# Patient Record
Sex: Female | Born: 1985 | Race: Black or African American | Hispanic: No | Marital: Single | Smoking: Never smoker
Health system: Southern US, Community
[De-identification: ages and names within clinical notes are randomized; demographics above are authoritative.]

## PROBLEM LIST (undated history)

## (undated) DIAGNOSIS — F419 Anxiety disorder, unspecified: Secondary | ICD-10-CM

## (undated) DIAGNOSIS — D649 Anemia, unspecified: Secondary | ICD-10-CM

## (undated) DIAGNOSIS — M222X1 Patellofemoral disorders, right knee: Secondary | ICD-10-CM

## (undated) DIAGNOSIS — M722 Plantar fascial fibromatosis: Secondary | ICD-10-CM

## (undated) DIAGNOSIS — Z973 Presence of spectacles and contact lenses: Secondary | ICD-10-CM

## (undated) DIAGNOSIS — F32A Depression, unspecified: Secondary | ICD-10-CM

## (undated) DIAGNOSIS — D219 Benign neoplasm of connective and other soft tissue, unspecified: Secondary | ICD-10-CM

## (undated) DIAGNOSIS — J45909 Unspecified asthma, uncomplicated: Secondary | ICD-10-CM

## (undated) DIAGNOSIS — I10 Essential (primary) hypertension: Secondary | ICD-10-CM

## (undated) DIAGNOSIS — G5600 Carpal tunnel syndrome, unspecified upper limb: Secondary | ICD-10-CM

## (undated) DIAGNOSIS — R7303 Prediabetes: Secondary | ICD-10-CM

## (undated) HISTORY — DX: Anemia, unspecified: D64.9

## (undated) HISTORY — DX: Benign neoplasm of connective and other soft tissue, unspecified: D21.9

## (undated) HISTORY — DX: Depression, unspecified: F32.A

## (undated) HISTORY — DX: Anxiety disorder, unspecified: F41.9

## (undated) HISTORY — DX: Essential (primary) hypertension: I10

---

## 2016-09-29 ENCOUNTER — Emergency Department (HOSPITAL_COMMUNITY): Payer: Self-pay

## 2016-09-29 ENCOUNTER — Emergency Department (HOSPITAL_COMMUNITY)
Admission: EM | Admit: 2016-09-29 | Discharge: 2016-09-29 | Disposition: A | Discharge status: Home / Self Care | Payer: Self-pay | Attending: Emergency Medicine | Admitting: Emergency Medicine

## 2016-09-29 ENCOUNTER — Encounter (HOSPITAL_COMMUNITY): Payer: Self-pay | Admitting: Emergency Medicine

## 2016-09-29 DIAGNOSIS — Z5321 Procedure and treatment not carried out due to patient leaving prior to being seen by health care provider: Secondary | ICD-10-CM | POA: Insufficient documentation

## 2016-09-29 DIAGNOSIS — J45901 Unspecified asthma with (acute) exacerbation: Secondary | ICD-10-CM | POA: Insufficient documentation

## 2016-09-29 HISTORY — DX: Unspecified asthma, uncomplicated: J45.909

## 2016-09-29 IMAGING — CR DG CHEST 2V
2 series · 2 of 2 positions shown · non-contrast
Comparison: None.

CLINICAL DATA: Difficulty breathing, asthma exacerbation

EXAM:
CHEST  2 VIEW

[w chest pa]
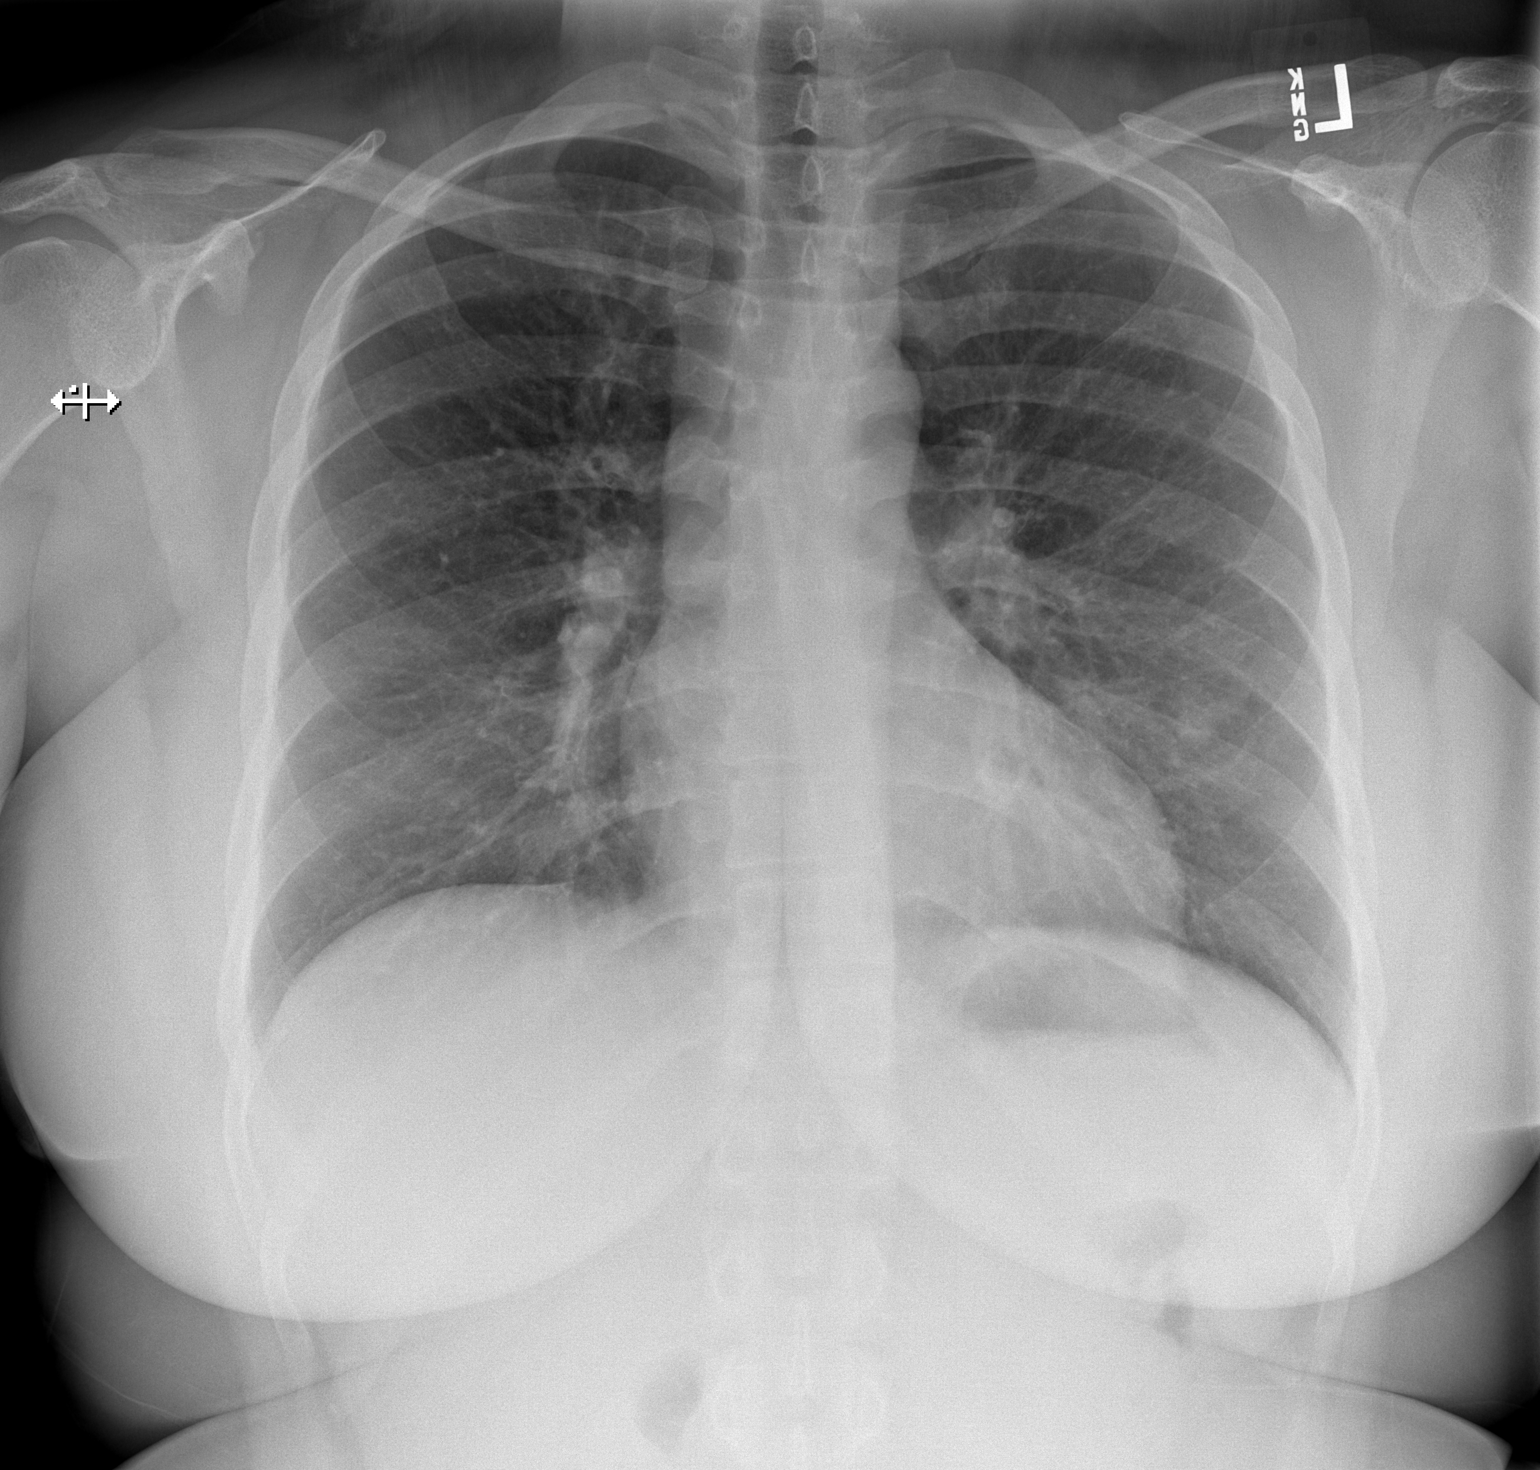

[w chest lat]
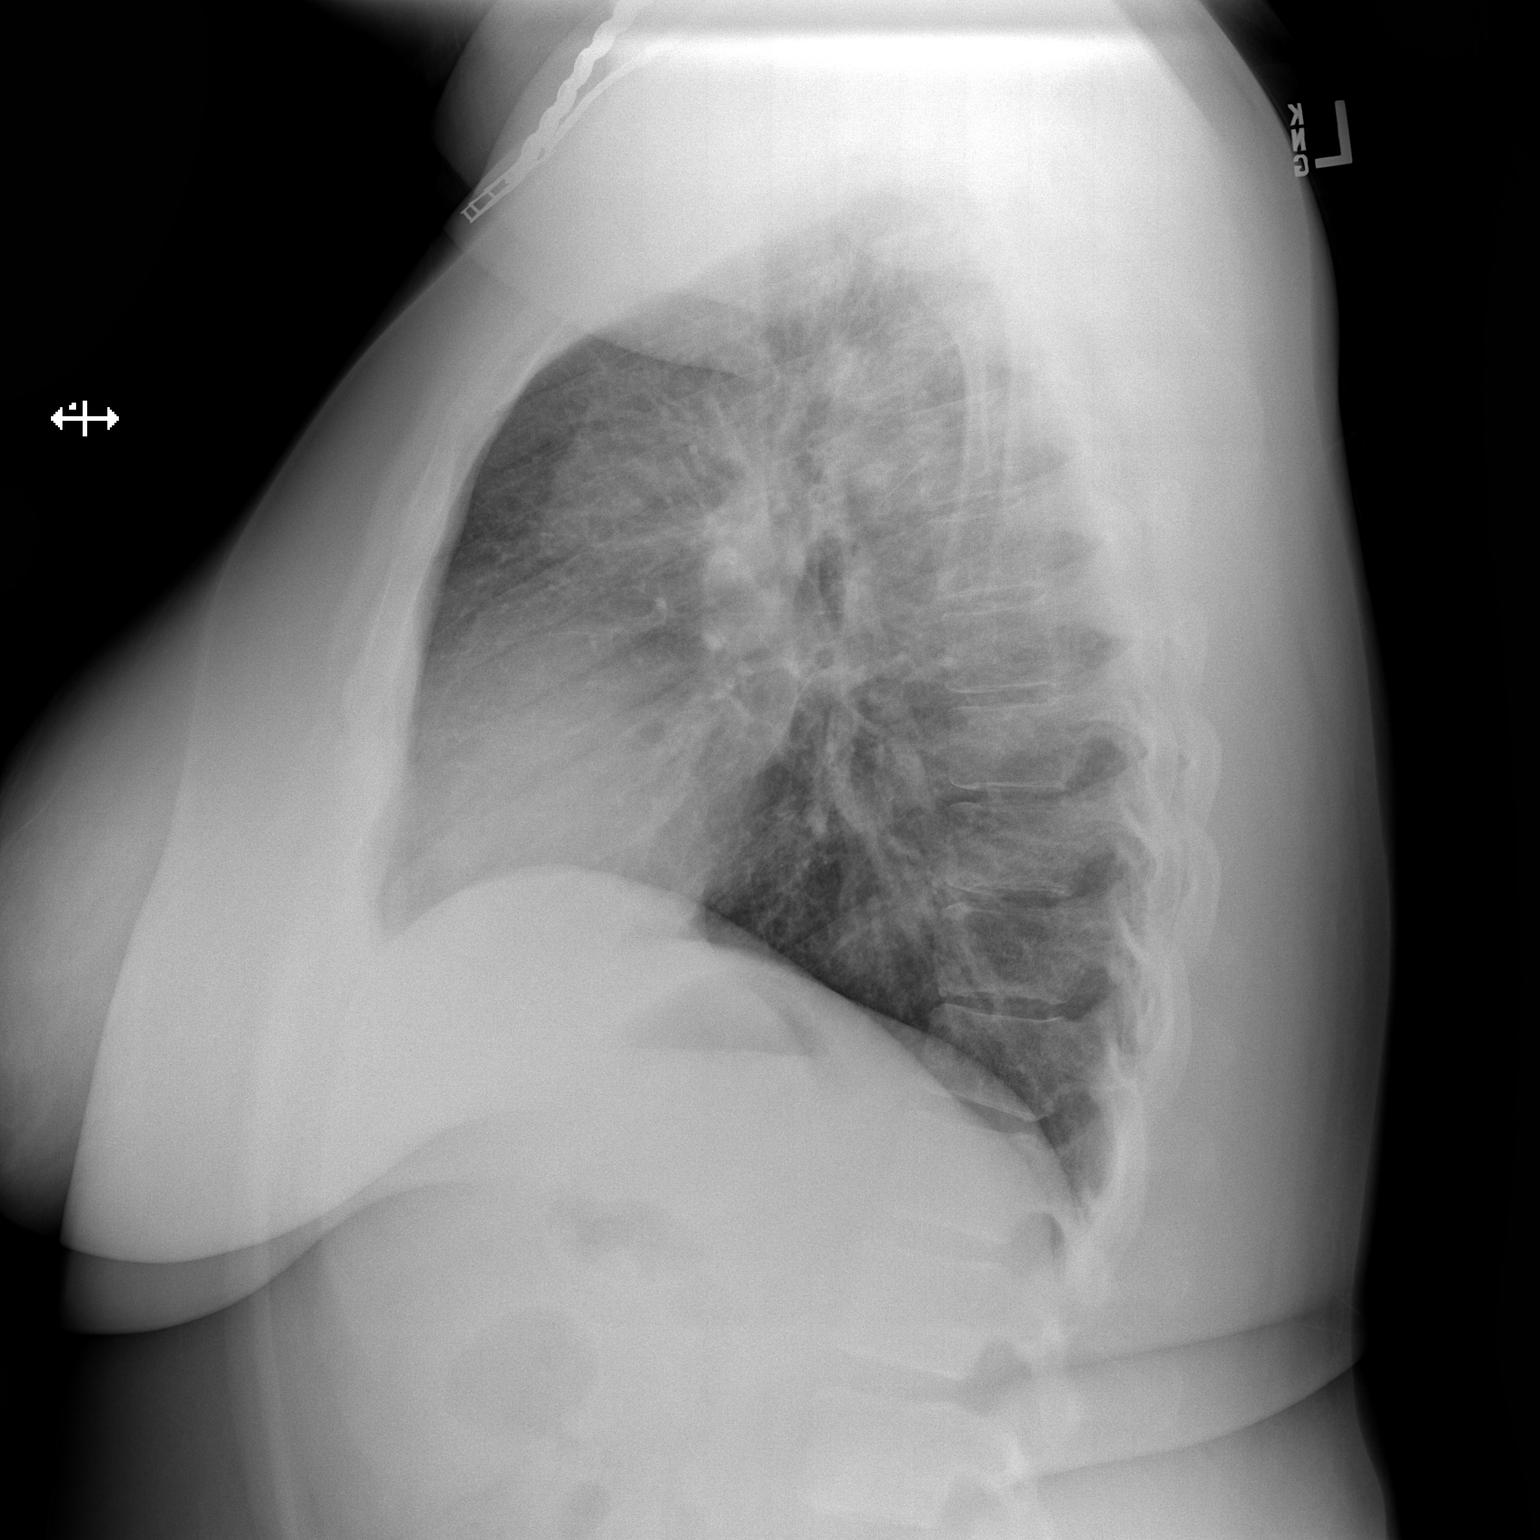

[2 of 2 positions shown; findings below may reference images not displayed]

FINDINGS: The right lung is clear. Subtle patchy opacity in the left mid lung.
No pleural effusion. Normal heart size. No pneumothorax.
IMPRESSION: Mild patchy opacity in the left lower lung could reflect atelectasis
or a small focus of infiltrate

## 2016-09-29 MED ORDER — ALBUTEROL SULFATE (2.5 MG/3ML) 0.083% IN NEBU
5.0000 mg | INHALATION_SOLUTION | Freq: Once | RESPIRATORY_TRACT | Status: AC
  Administered 2016-09-29: 5 mg via RESPIRATORY_TRACT
  Filled 2016-09-29: qty 6

## 2016-09-29 NOTE — ED Notes (Signed)
Pt called for room no answer

## 2016-09-29 NOTE — ED Triage Notes (Signed)
Pt from home with complaints of asthma exacerbation. Pt states she caught a cold about a week ago and has had difficulty ever since. Pt states she has used her inhaler "many times" this evening with little relief. Pt has audible expiratory wheezes. Pt is able to maintain her oxygen saturation at 99% on RA

## 2017-11-27 ENCOUNTER — Ambulatory Visit: Payer: Self-pay | Admitting: Nurse Practitioner

## 2017-11-27 VITALS — BP 120/90 | HR 80 | Temp 98.4°F | Resp 18 | Wt 276.2 lb

## 2017-11-27 DIAGNOSIS — J209 Acute bronchitis, unspecified: Secondary | ICD-10-CM

## 2017-11-27 MED ORDER — PREDNISONE 10 MG (21) PO TBPK: ORAL_TABLET | ORAL | 0 refills | Status: AC

## 2017-11-27 MED ORDER — PREDNISONE 10 MG (21) PO TBPK: ORAL_TABLET | ORAL | 0 refills | Status: DC

## 2017-11-27 MED ORDER — DEXTROMETHORPHAN-GUAIFENESIN 10-100 MG/5ML PO LIQD: 5.0000 mL | ORAL | 0 refills | Status: AC | PRN

## 2017-11-27 MED ORDER — ALBUTEROL SULFATE HFA 108 (90 BASE) MCG/ACT IN AERS: 2.0000 | INHALATION_SPRAY | Freq: Four times a day (QID) | RESPIRATORY_TRACT | 0 refills | Status: DC | PRN

## 2017-11-27 MED ORDER — DEXTROMETHORPHAN-GUAIFENESIN 10-100 MG/5ML PO LIQD: 5.0000 mL | ORAL | 0 refills | Status: DC | PRN

## 2017-11-27 NOTE — Progress Notes (Signed)
Subjective:     Alexis Mccormick is a 32 y.o. female here for evaluation of a cough. Onset of symptoms was 2 weeks ago. Symptoms have been gradually worsening since that time. The cough is productive of clear sputum and is aggravated by  eating and reclining position. Associated symptoms include: postnasal drip, shortness of breath and sputum production.  Patient states her symptoms started with a sore throat, and a runny nose which have also resolved since this time.  Patient does have a history of asthma. Patient does not have a history of environmental allergens. Patient has not traveled recently. Patient does not have a history of smoking.  He has tried Robitussin, which made her cough worse, and DayQuil and NyQuil with no improvement.  The patient states she gets nauseated mostly in the mornings after she wakes up due to the phlegm that is in her chest and from coughing so much.  The patient denies any recent asthma exacerbations since last year.  The patient states she ran out of her inhaler, states that is very expensive.   The following portions of the patient's history were reviewed and updated as appropriate: allergies, current medications and past medical history.  Review of Systems Constitutional: positive for anorexia and fatigue, negative for chills, fevers, malaise and sweats Eyes: negative Ears, nose, mouth, throat, and face: positive for sore throat, negative for ear drainage, earaches and hoarseness Respiratory: positive for asthma, cough, dyspnea on exertion and sputum, negative for hemoptysis, pleurisy/chest pain, pneumonia and stridor Cardiovascular: negative Gastrointestinal: positive for decreased appetite, negative for abdominal pain, diarrhea, nausea and vomiting Neurological: negative Allergic/Immunologic: negative    Objective:   BP 120/90 (BP Location: Right Arm, Patient Position: Sitting, Cuff Size: Large)   Pulse 80   Temp 98.4 F (36.9 C) (Oral)   Resp 18   Wt 276  lb 3.2 oz (125.3 kg)   SpO2 98%   BMI 47.41 kg/m  General appearance: alert, cooperative, fatigued and no distress Head: Normocephalic, without obvious abnormality, atraumatic Eyes: conjunctivae/corneas clear. PERRL, EOM's intact. Fundi benign. Ears: normal TM's and external ear canals both ears Nose: Nares normal. Septum midline. Mucosa normal. No drainage or sinus tenderness. Throat: lips, mucosa, and tongue normal; teeth and gums normal Lungs: clear to auscultation bilaterally Heart: regular rate and rhythm, S1, S2 normal, no murmur, click, rub or gallop Abdomen: soft, non-tender; bowel sounds normal; no masses,  no organomegaly Pulses: 2+ and symmetric Skin: Skin color, texture, turgor normal. No rashes or lesions Lymph nodes: cervical and submandibular nodes normal Neurologic: Grossly normal    Assessment:    Acute Bronchitis    Plan:    Explained lack of efficacy of antibiotics in viral disease. Antitussives per medication orders. Avoid exposure to tobacco smoke and fumes. B-agonist inhaler. Call if shortness of breath worsens, blood in sputum, change in character of cough, development of fever or chills, inability to maintain nutrition and hydration. Avoid exposure to tobacco smoke and fumes. Sterapred Dose Pack. Also refilled patient's albuterol inhaler.  Instructed patient to go to the ER if she develops increased difficulty breathing shortness of breath or other concerns.  Patient verbalizes understanding and has no questions at time of discharge. Meds ordered this encounter  Medications  . predniSONE (STERAPRED UNI-PAK 21 TAB) 10 MG (21) TBPK tablet    Sig: Take as directed.    Dispense:  21 tablet    Refill:  0    Order Specific Question:   Supervising Provider  Answer:   Ricard Dillon [[id number]]  . dextromethorphan-guaiFENesin (ROBITUSSIN-DM) 10-100 MG/5ML liquid    Sig: Take 5 mLs by mouth every 4 (four) hours as needed for up to 10 days for cough.    Dispense:   310 mL    Refill:  0    Order Specific Question:   Supervising Provider    Answer:   Ricard Dillon [[id number]]  . albuterol (PROVENTIL HFA;VENTOLIN HFA) 108 (90 Base) MCG/ACT inhaler    Sig: Inhale 2 puffs into the lungs every 6 (six) hours as needed for wheezing or shortness of breath.    Dispense:  1 Inhaler    Refill:  0    Order Specific Question:   Supervising Provider    Answer:   Ricard Dillon [phone]

## 2017-11-27 NOTE — Patient Instructions (Signed)
Acute Bronchitis, Adult Acute bronchitis is sudden (acute) swelling of the air tubes (bronchi) in the lungs. Acute bronchitis causes these tubes to fill with mucus, which can make it hard to breathe. It can also cause coughing or wheezing. In adults, acute bronchitis usually goes away within 2 weeks. A cough caused by bronchitis may last up to 3 weeks. Smoking, allergies, and asthma can make the condition worse. Repeated episodes of bronchitis may cause further lung problems, such as chronic obstructive pulmonary disease (COPD). What are the causes? This condition can be caused by germs and by substances that irritate the lungs, including:  Cold and flu viruses. This condition is most often caused by the same virus that causes a cold.  Bacteria.  Exposure to tobacco smoke, dust, fumes, and air pollution.  What increases the risk? This condition is more likely to develop in people who:  Have close contact with someone with acute bronchitis.  Are exposed to lung irritants, such as tobacco smoke, dust, fumes, and vapors.  Have a weak immune system.  Have a respiratory condition such as asthma.  What are the signs or symptoms? Symptoms of this condition include:  A cough.  Coughing up clear, yellow, or green mucus.  Wheezing.  Chest congestion.  Shortness of breath.  A fever.  Body aches.  Chills.  A sore throat.  How is this diagnosed? This condition is usually diagnosed with a physical exam. During the exam, your health care provider may order tests, such as chest X-rays, to rule out other conditions. He or she may also:  Test a sample of your mucus for bacterial infection.  Check the level of oxygen in your blood. This is done to check for pneumonia.  Do a chest X-ray or lung function testing to rule out pneumonia and other conditions.  Perform blood tests.  Your health care provider will also ask about your symptoms and medical history. How is this  treated? Most cases of acute bronchitis clear up over time without treatment. Your health care provider may recommend:  Drinking more fluids. Drinking more makes your mucus thinner, which may make it easier to breathe.  Taking a medicine for a fever or cough.  Taking an antibiotic medicine.  Using an inhaler to help improve shortness of breath and to control a cough.  Using a cool mist vaporizer or humidifier to make it easier to breathe.  Follow these instructions at home: Medicines  Take over-the-counter and prescription medicines only as told by your health care provider.  If you were prescribed an antibiotic, take it as told by your health care provider. Do not stop taking the antibiotic even if you start to feel better. General instructions  Get plenty of rest.  Drink enough fluids to keep your urine clear or pale yellow.  Avoid smoking and secondhand smoke. Exposure to cigarette smoke or irritating chemicals will make bronchitis worse. If you smoke and you need help quitting, ask your health care provider. Quitting smoking will help your lungs heal faster.  Use an inhaler, cool mist vaporizer, or humidifier as told by your health care provider.  Keep all follow-up visits as told by your health care provider. This is important. How is this prevented? To lower your risk of getting this condition again:  Wash your hands often with soap and water. If soap and water are not available, use hand sanitizer.  Avoid contact with people who have cold symptoms.  Try not to touch your hands to your   mouth, nose, or eyes.  Make sure to get the flu shot every year.  Contact a health care provider if:  Your symptoms do not improve in 2 weeks of treatment. Get help right away if:  You cough up blood.  You have chest pain.  You have severe shortness of breath.  You become dehydrated.  You faint or keep feeling like you are going to faint.  You keep vomiting.  You have a  severe headache.  Your fever or chills gets worse. This information is not intended to replace advice given to you by your health care provider. Make sure you discuss any questions you have with your health care provider. Document Released: 07/04/2004 Document Revised: 12/20/2015 Document Reviewed: 11/15/2015 Elsevier Interactive Patient Education  2018 [place].  Cough, Adult Coughing is a reflex that clears your throat and your airways. Coughing helps to heal and protect your lungs. It is normal to cough occasionally, but a cough that happens with other symptoms or lasts a long time may be a sign of a condition that needs treatment. A cough may last only 2-3 weeks (acute), or it may last longer than 8 weeks (chronic). What are the causes? Coughing is commonly caused by:  Breathing in substances that irritate your lungs.  A viral or bacterial respiratory infection.  Allergies.  Asthma.  Postnasal drip.  Smoking.  Acid backing up from the stomach into the esophagus (gastroesophageal reflux).  Certain medicines.  Chronic lung problems, including COPD (or rarely, lung cancer).  Other medical conditions such as heart failure.  Follow these instructions at home: Pay attention to any changes in your symptoms. Take these actions to help with your discomfort:  Take medicines only as told by your health care provider. ? If you were prescribed an antibiotic medicine, take it as told by your health care provider. Do not stop taking the antibiotic even if you start to feel better. ? Talk with your health care provider before you take a cough suppressant medicine.  Drink enough fluid to keep your urine clear or pale yellow.  If the air is dry, use a cold steam vaporizer or humidifier in your bedroom or your home to help loosen secretions.  Avoid anything that causes you to cough at work or at home.  If your cough is worse at night, try sleeping in a semi-upright  position.  Avoid cigarette smoke. If you smoke, quit smoking. If you need help quitting, ask your health care provider.  Avoid caffeine.  Avoid alcohol.  Rest as needed.  Contact a health care provider if:  You have new symptoms.  You cough up pus.  Your cough does not get better after 2-3 weeks, or your cough gets worse.  You cannot control your cough with suppressant medicines and you are losing sleep.  You develop pain that is getting worse or pain that is not controlled with pain medicines.  You have a fever.  You have unexplained weight loss.  You have night sweats. Get help right away if:  You cough up blood.  You have difficulty breathing.  Your heartbeat is very fast. This information is not intended to replace advice given to you by your health care provider. Make sure you discuss any questions you have with your health care provider. Document Released: 11/23/2010 Document Revised: 11/02/2015 Document Reviewed: 08/03/2014 Elsevier Interactive Patient Education  [employer].

## 2017-11-27 NOTE — Addendum Note (Signed)
Addended by: Cari Caraway on: 11/27/2017 04:21 PM   Modules accepted: Orders

## 2018-06-10 DIAGNOSIS — U071 COVID-19: Secondary | ICD-10-CM

## 2018-06-10 HISTORY — DX: COVID-19: U07.1

## 2019-04-23 ENCOUNTER — Other Ambulatory Visit: Payer: Self-pay

## 2019-04-23 DIAGNOSIS — Z20822 Contact with and (suspected) exposure to covid-19: Secondary | ICD-10-CM

## 2019-04-26 LAB — NOVEL CORONAVIRUS, NAA: SARS-CoV-2, NAA: DETECTED — AB

## 2020-06-10 HISTORY — PX: WISDOM TOOTH EXTRACTION: SHX21

## 2020-06-10 HISTORY — PX: ESOPHAGOGASTRODUODENOSCOPY: SHX1529

## 2020-06-10 HISTORY — PX: GRAFT APPLICATION: SHX6696

## 2020-07-18 ENCOUNTER — Encounter (HOSPITAL_COMMUNITY): Payer: Self-pay

## 2020-07-18 ENCOUNTER — Emergency Department (HOSPITAL_COMMUNITY)
Admission: EM | Admit: 2020-07-18 | Discharge: 2020-07-18 | Disposition: A | Discharge status: Home / Self Care | Payer: 59 | Attending: Emergency Medicine | Admitting: Emergency Medicine

## 2020-07-18 ENCOUNTER — Emergency Department (HOSPITAL_COMMUNITY): Payer: 59

## 2020-07-18 DIAGNOSIS — M791 Myalgia, unspecified site: Secondary | ICD-10-CM | POA: Insufficient documentation

## 2020-07-18 DIAGNOSIS — Y92481 Parking lot as the place of occurrence of the external cause: Secondary | ICD-10-CM | POA: Diagnosis not present

## 2020-07-18 DIAGNOSIS — Y9301 Activity, walking, marching and hiking: Secondary | ICD-10-CM | POA: Insufficient documentation

## 2020-07-18 DIAGNOSIS — J45909 Unspecified asthma, uncomplicated: Secondary | ICD-10-CM | POA: Insufficient documentation

## 2020-07-18 DIAGNOSIS — M25562 Pain in left knee: Secondary | ICD-10-CM | POA: Insufficient documentation

## 2020-07-18 IMAGING — CR DG KNEE COMPLETE 4+V*L*
4 series · 4 of 4 positions shown · non-contrast
Comparison: None.

CLINICAL DATA: 34-year-old female with left knee pain.

EXAM:
LEFT KNEE - COMPLETE 4+ VIEW

[t knee ap left]
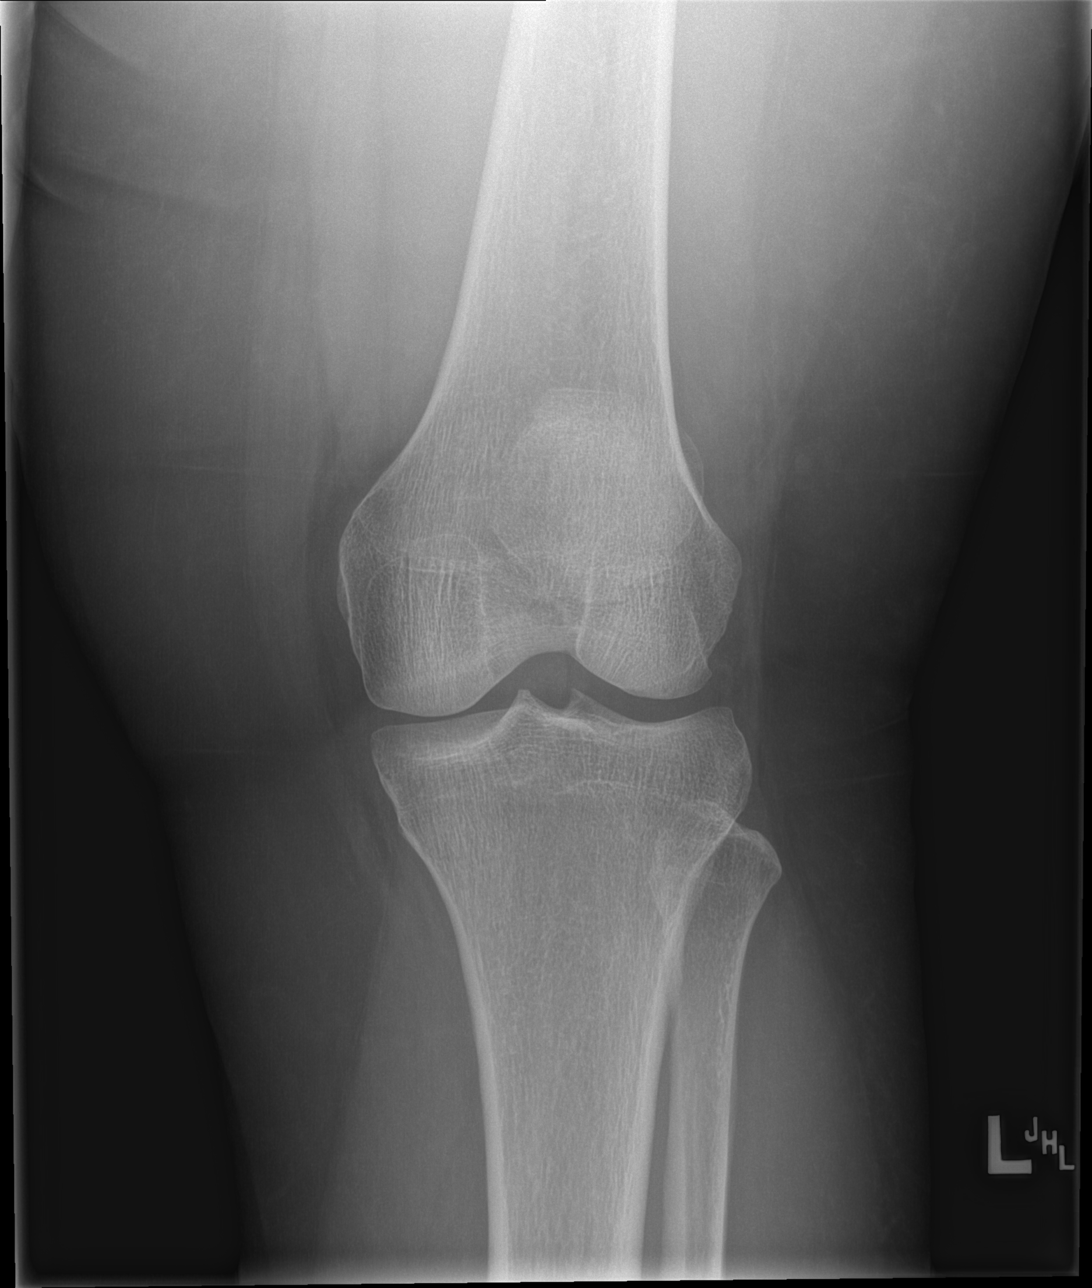

[t knee obl left (1 of 2)]
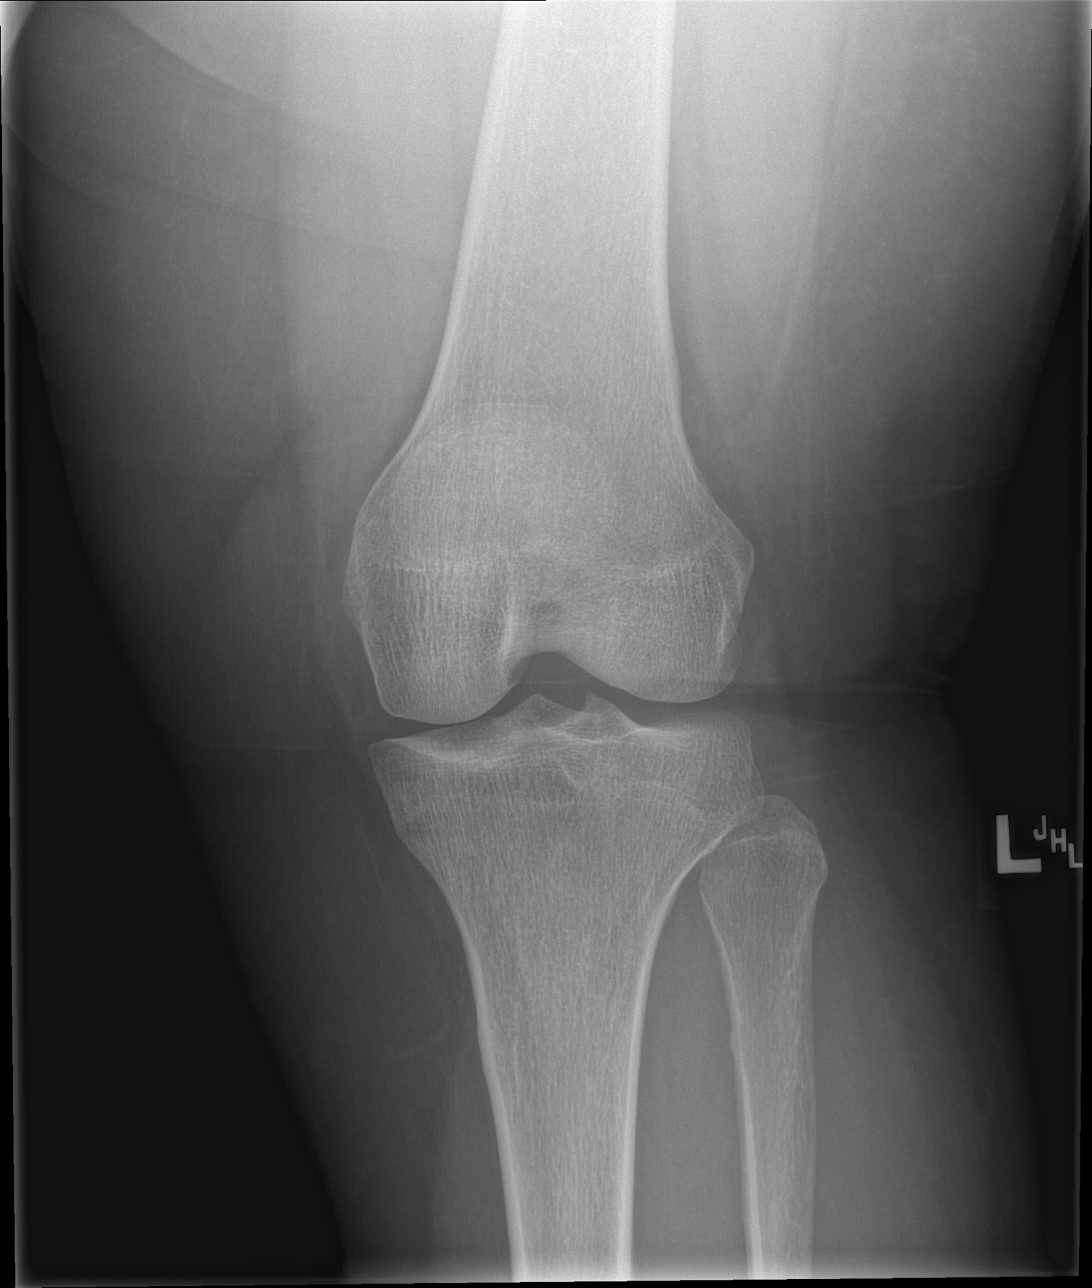

[t knee obl left (2 of 2)]
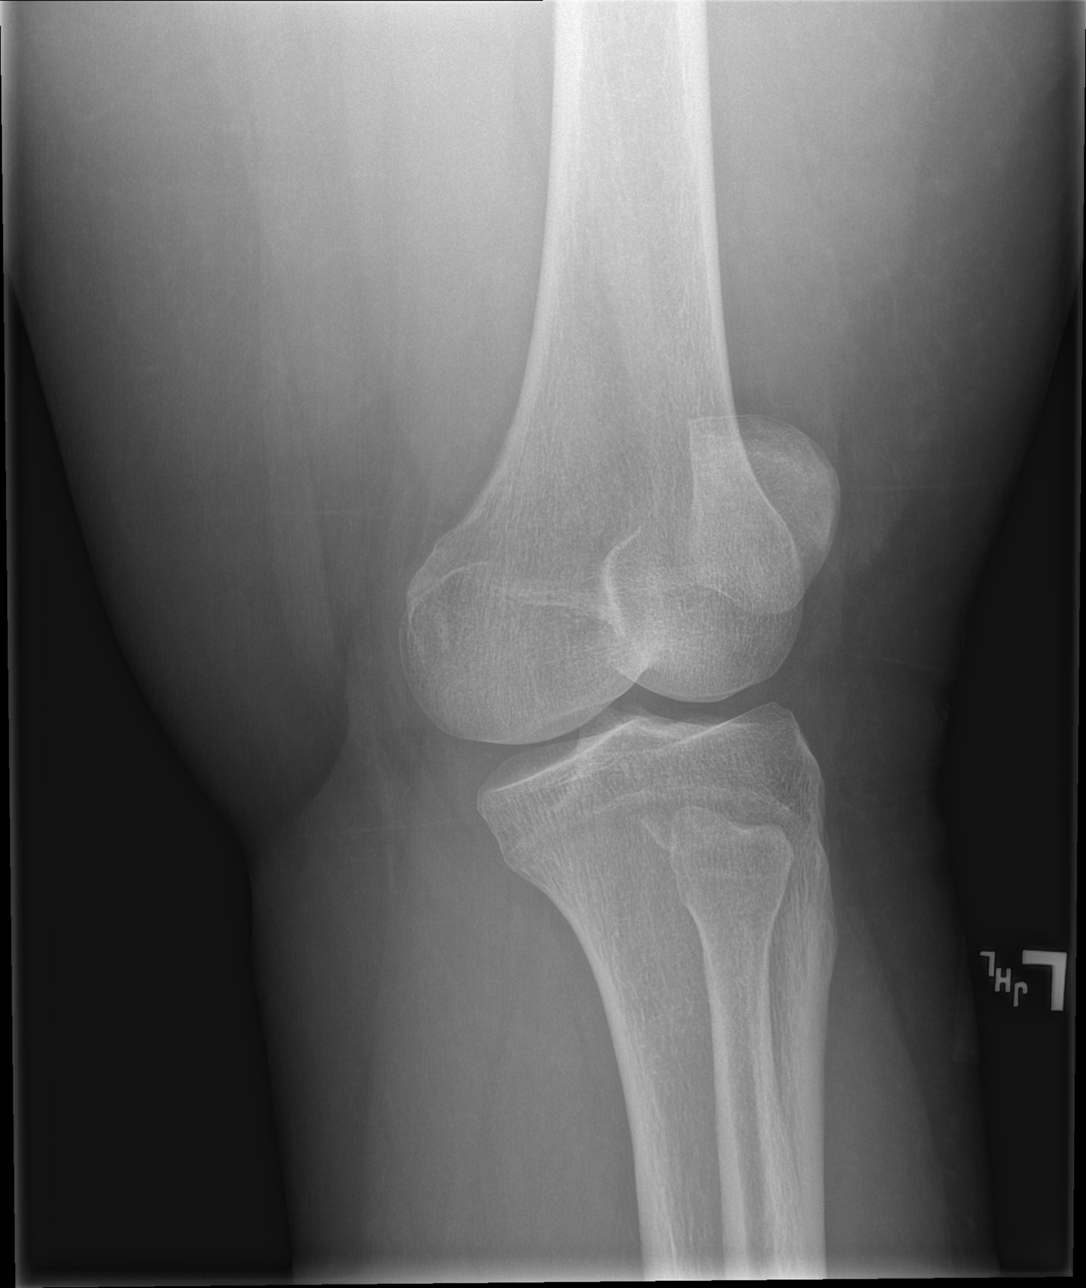

[t knee lat left]
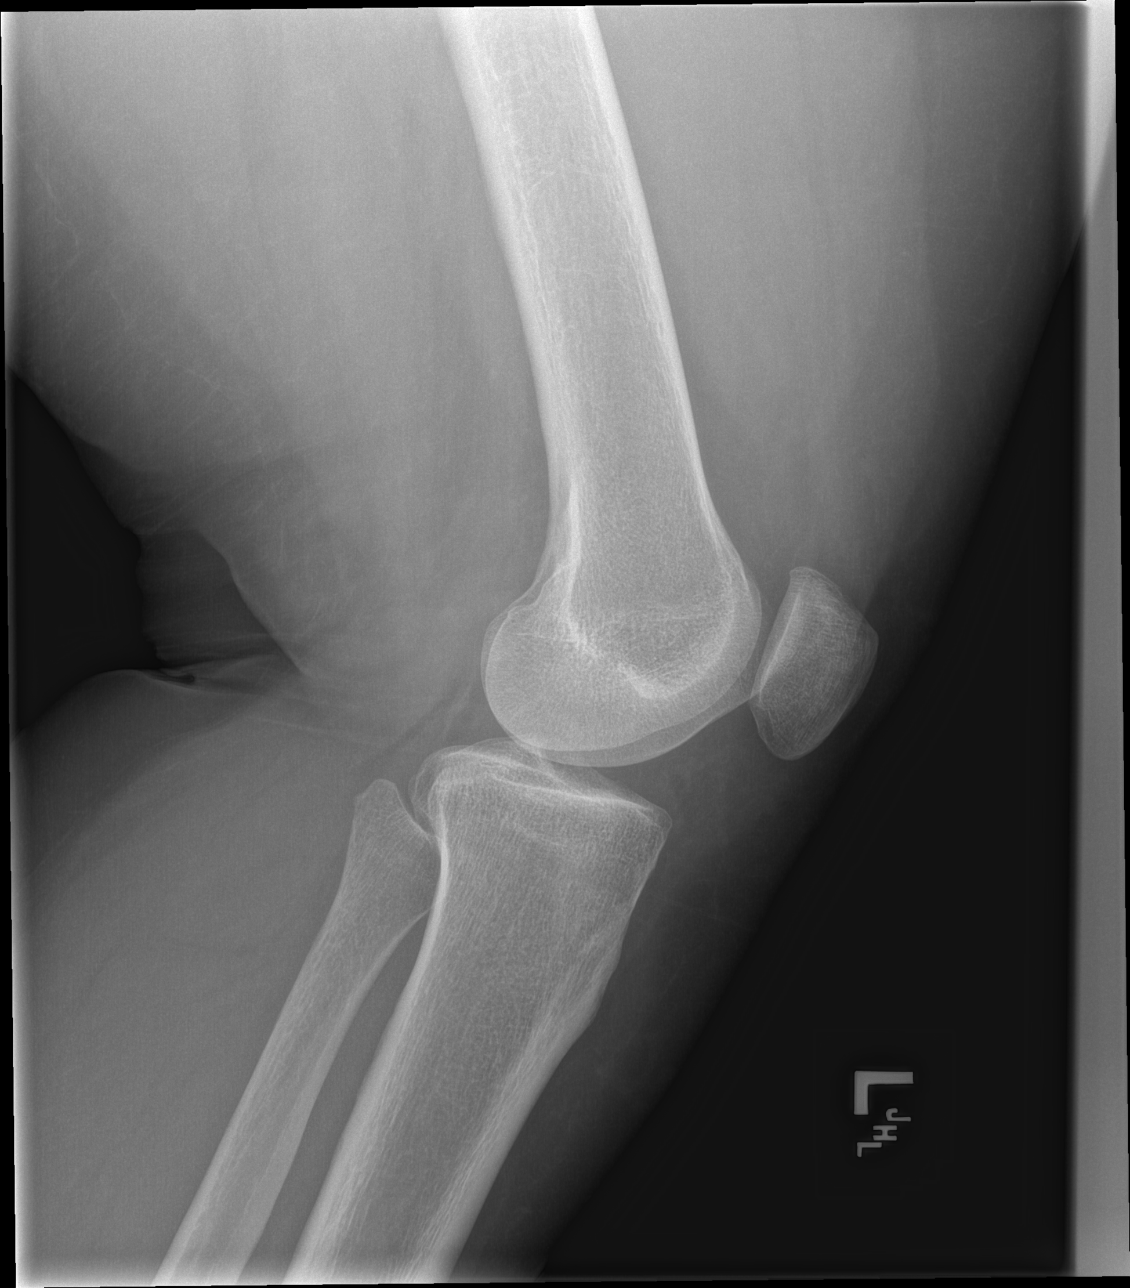

[4 of 4 positions shown; findings below may reference images not displayed]

FINDINGS: There is no acute fracture or dislocation. The bones are well
mineralized. No arthritic changes. No significant joint effusion.
The soft tissues are unremarkable.
IMPRESSION: Negative.

## 2020-07-18 MED ORDER — IBUPROFEN 800 MG PO TABS
800.0000 mg | ORAL_TABLET | Freq: Once | ORAL | Status: AC
  Administered 2020-07-18: 800 mg via ORAL
  Filled 2020-07-18: qty 1
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
## 2020-07-18 NOTE — Discharge Instructions (Signed)
Take Motrin Tylenol as needed as directed for pain.  Warm compresses to sore muscles.  Follow-up with your doctor if pain persist.  Referral to sports medicine if not improving.

## 2020-07-24 ENCOUNTER — Other Ambulatory Visit: Payer: Self-pay

## 2020-07-24 ENCOUNTER — Ambulatory Visit: Payer: Self-pay

## 2020-07-24 ENCOUNTER — Ambulatory Visit (INDEPENDENT_AMBULATORY_CARE_PROVIDER_SITE_OTHER): Payer: PRIVATE HEALTH INSURANCE | Admitting: Family Medicine

## 2020-07-24 VITALS — BP 130/80 | Ht 64.0 in | Wt 260.0 lb

## 2020-07-24 DIAGNOSIS — M24852 Other specific joint derangements of left hip, not elsewhere classified: Secondary | ICD-10-CM | POA: Insufficient documentation

## 2020-07-24 DIAGNOSIS — S301XXA Contusion of abdominal wall, initial encounter: Secondary | ICD-10-CM | POA: Diagnosis not present

## 2020-07-24 DIAGNOSIS — M25562 Pain in left knee: Secondary | ICD-10-CM

## 2020-07-24 DIAGNOSIS — S301XXD Contusion of abdominal wall, subsequent encounter: Secondary | ICD-10-CM | POA: Insufficient documentation

## 2020-07-24 DIAGNOSIS — M23204 Derangement of unspecified medial meniscus due to old tear or injury, left knee: Secondary | ICD-10-CM | POA: Diagnosis not present

## 2020-07-24 DIAGNOSIS — S73199A Other sprain of unspecified hip, initial encounter: Secondary | ICD-10-CM | POA: Insufficient documentation

## 2020-07-24 MED ORDER — IBUPROFEN 600 MG PO TABS: 600.0000 mg | ORAL_TABLET | Freq: Three times a day (TID) | ORAL | 1 refills | Status: DC | PRN
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
## 2020-07-24 NOTE — Patient Instructions (Signed)
Nice to meet you Please try ice on the knee  Please try heat on the left hip  Physical therapy will give you a call.  Use the ibuprofen for 3 days and then as needed   Please send me a message in MyChart with any questions or updates.  Please see me back in 4 weeks.   --Dr. Raeford Razor
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
## 2020-08-10 ENCOUNTER — Other Ambulatory Visit: Payer: Self-pay

## 2020-08-10 ENCOUNTER — Ambulatory Visit: Payer: PRIVATE HEALTH INSURANCE | Attending: Family Medicine

## 2020-08-10 DIAGNOSIS — M6281 Muscle weakness (generalized): Secondary | ICD-10-CM

## 2020-08-10 DIAGNOSIS — M25561 Pain in right knee: Secondary | ICD-10-CM | POA: Diagnosis present

## 2020-08-10 DIAGNOSIS — M545 Low back pain, unspecified: Secondary | ICD-10-CM

## 2020-08-10 DIAGNOSIS — M25562 Pain in left knee: Secondary | ICD-10-CM

## 2020-08-10 DIAGNOSIS — M25552 Pain in left hip: Secondary | ICD-10-CM

## 2020-08-10 DIAGNOSIS — M62830 Muscle spasm of back: Secondary | ICD-10-CM
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
## 2020-08-10 NOTE — Patient Instructions (Signed)
Access Code: [email or web address] URL: [email or web address] Date: 08/10/2020 Prepared by: Sherlynn Stalls  Exercises Clamshell - 1 x daily - 5 x weekly - 2 sets - 10 reps Supine Bridge - 1 x daily - 5 x weekly - 2 sets - 10 reps Supine Single Bent Knee Fallout - 1 x daily - 5 x weekly - 2 sets - 10 reps Seated Piriformis Stretch with Trunk Bend - 1 x daily - 5 x weekly - 4 sets - 30 second hold Seated Hamstring Set - 1 x daily - 5 x weekly - 2 sets - 10 reps - 5 seconds hold

## 2020-08-17 ENCOUNTER — Ambulatory Visit (HOSPITAL_BASED_OUTPATIENT_CLINIC_OR_DEPARTMENT_OTHER)
Admission: RE | Admit: 2020-08-17 | Discharge: 2020-08-17 | Disposition: A | Discharge status: Home / Self Care | Payer: PRIVATE HEALTH INSURANCE | Source: Ambulatory Visit | Attending: Family Medicine | Admitting: Family Medicine

## 2020-08-17 ENCOUNTER — Other Ambulatory Visit: Payer: Self-pay | Admitting: Family Medicine

## 2020-08-17 ENCOUNTER — Other Ambulatory Visit: Payer: Self-pay

## 2020-08-17 ENCOUNTER — Ambulatory Visit: Payer: PRIVATE HEALTH INSURANCE | Admitting: Physical Therapy

## 2020-08-17 DIAGNOSIS — M25562 Pain in left knee: Secondary | ICD-10-CM

## 2020-08-17 DIAGNOSIS — S301XXA Contusion of abdominal wall, initial encounter: Secondary | ICD-10-CM | POA: Insufficient documentation

## 2020-08-17 DIAGNOSIS — M25552 Pain in left hip: Secondary | ICD-10-CM

## 2020-08-17 DIAGNOSIS — M6281 Muscle weakness (generalized): Secondary | ICD-10-CM

## 2020-08-17 IMAGING — DX DG HIP (WITH OR WITHOUT PELVIS) 2-3V*L*
3 series · 3 of 3 positions shown · non-contrast
Comparison: None.

CLINICAL DATA: Left hip pain

EXAM:
DG HIP (WITH OR WITHOUT PELVIS) 2-3V LEFT

[pelvis ap]
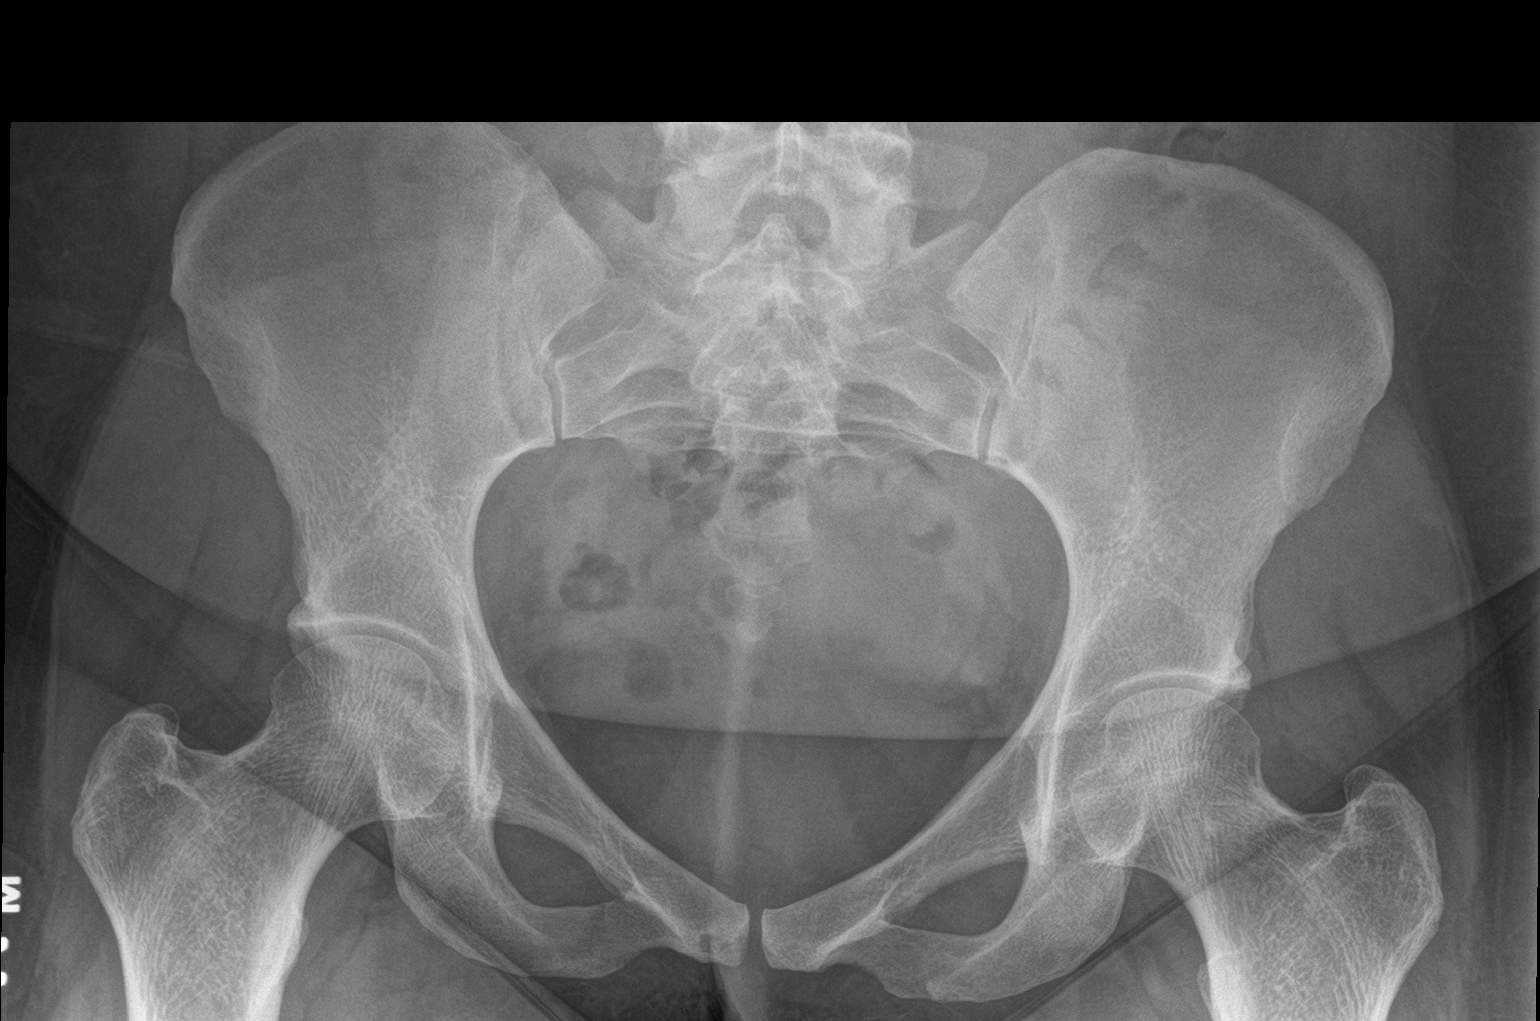

[hip ap]
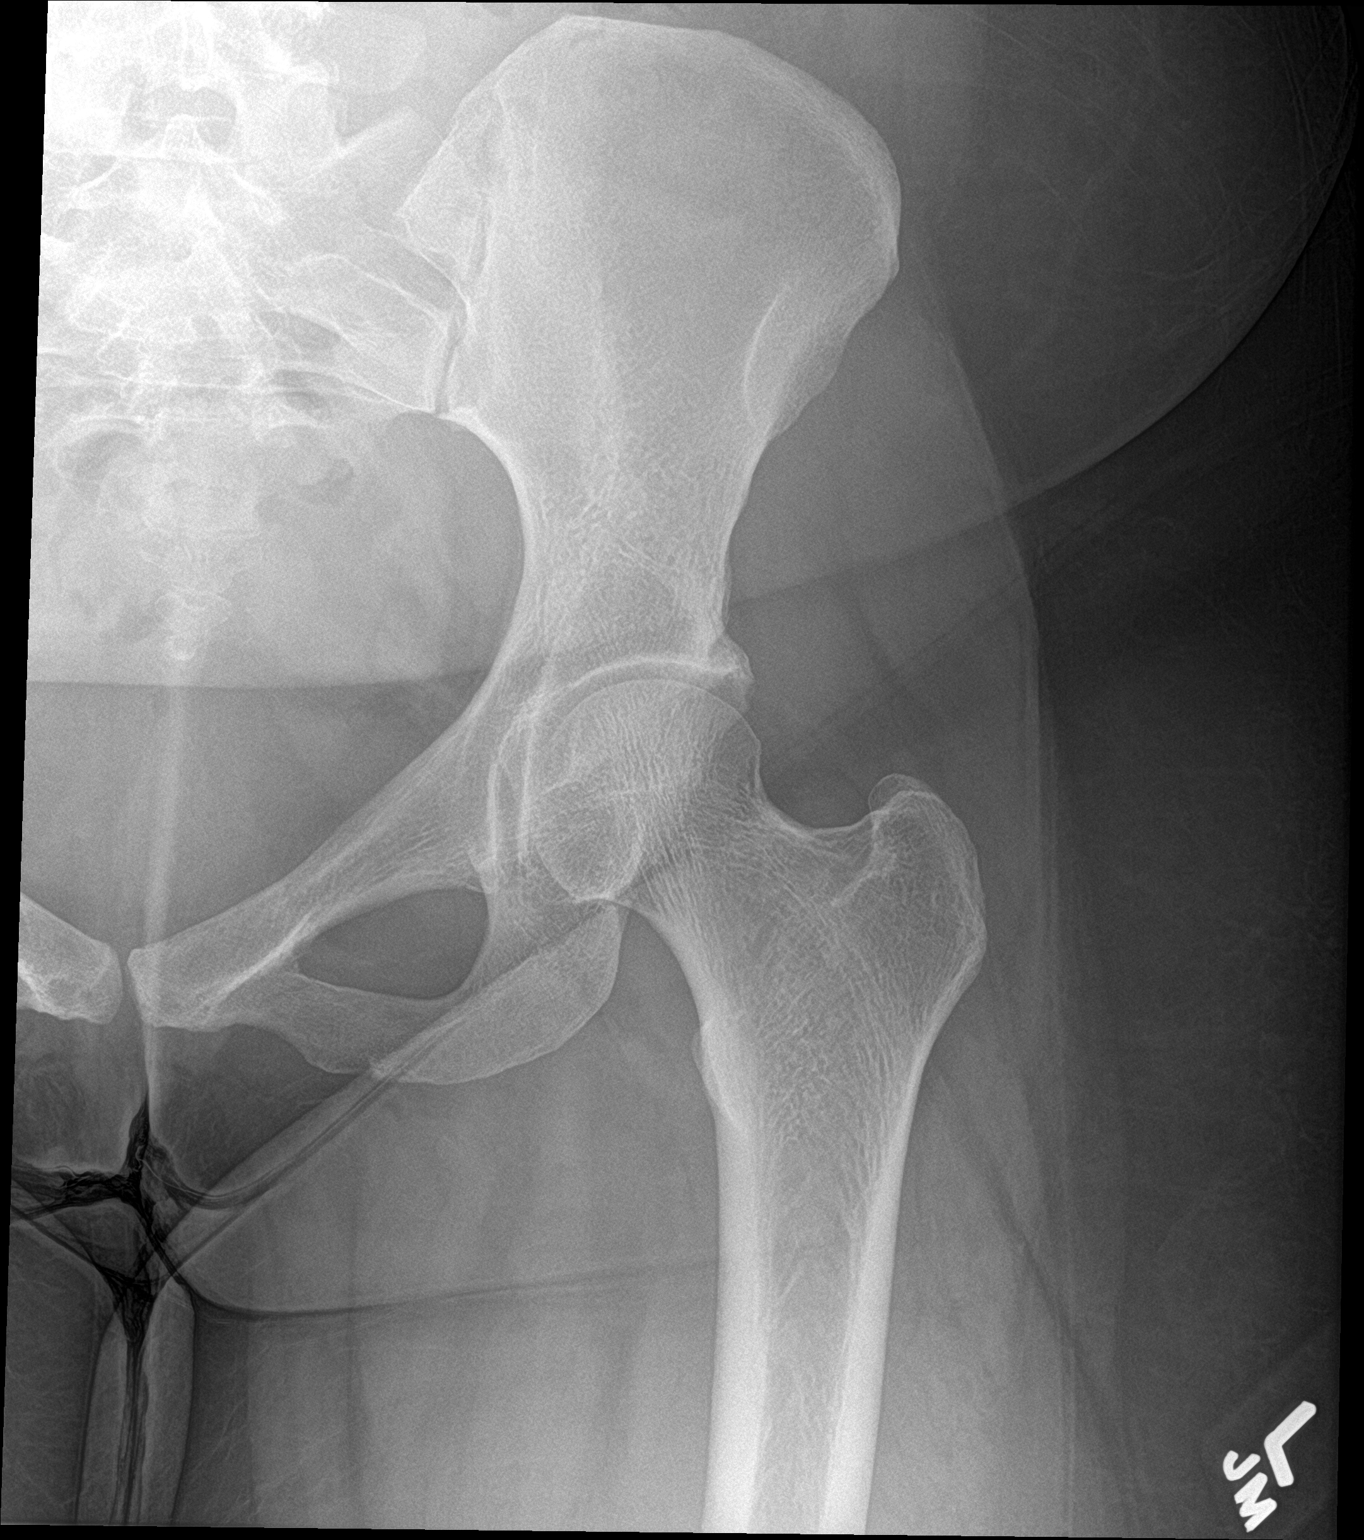

[hip lat]
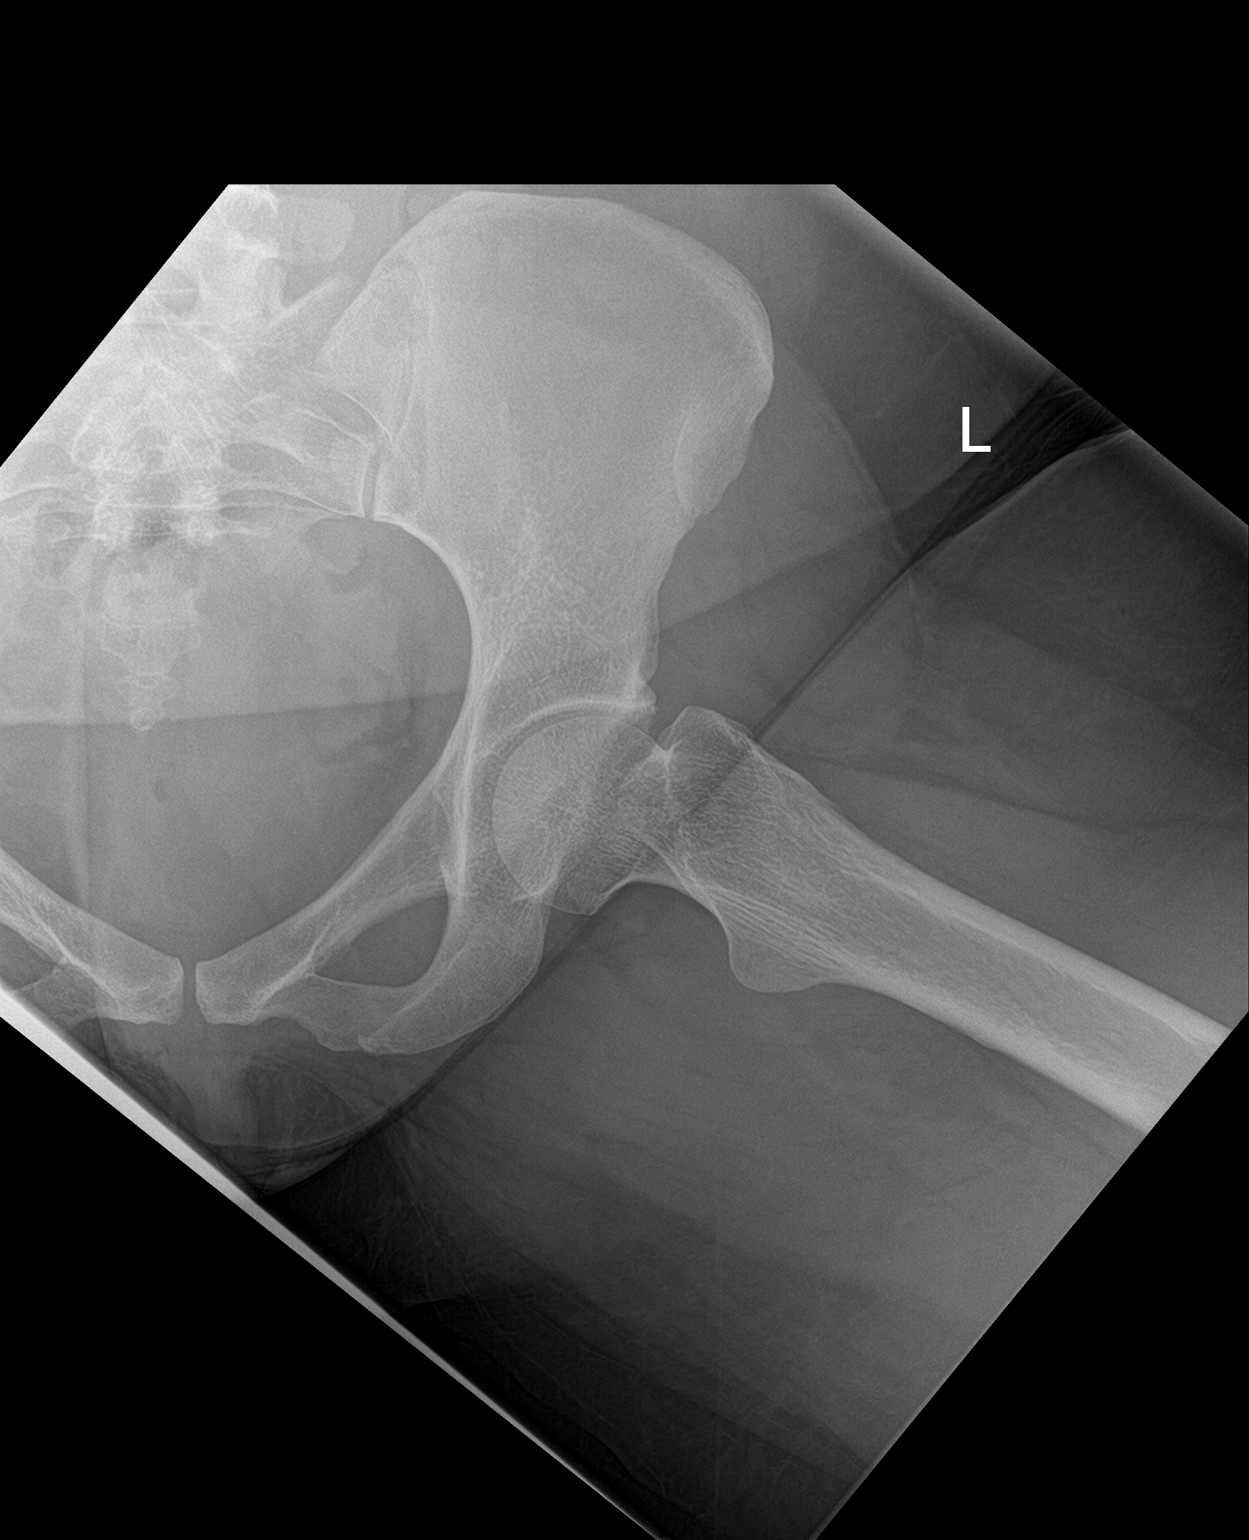

[3 of 3 positions shown; findings below may reference images not displayed]

FINDINGS: There is no evidence of hip fracture or dislocation. There is no
evidence of arthropathy or other focal bone abnormality.
IMPRESSION: Negative.

## 2020-08-17 NOTE — Therapy (Signed)
[place]. [place], [place], [address] Phone: [phone]   Fax:  [phone]  Physical Therapy Treatment  Patient Details  Name: Alexis Mccormick MRN: [id number] Date of Birth: 1985-06-13 Referring Provider (PT): Clinton Quant Date: 08/17/2020   PT End of Session - 08/17/20 0839    Visit Number 2    Number of Visits 9    Date for PT Re-Evaluation 10/05/20    PT Start Time 0757    PT Stop Time 0840    PT Time Calculation (min) 43 min           Past Medical History:  Diagnosis Date  . Asthma     No past surgical history on file.  There were no vitals filed for this visit.   Subjective Assessment - 08/17/20 0800    Subjective ex are going well at home. hip is stiff, knee is painful    Currently in Pain? Yes    Pain Score 6     Pain Location Knee    Pain Orientation Left;Medial                             [facility] Adult PT Treatment/Exercise - 08/17/20 0001      Exercises   Exercises Knee/Hip;Lumbar      Lumbar Exercises: Aerobic   Recumbent Bike L 2 5 min      Lumbar Exercises: Supine   Bridge Non-compliant;15 reps;3 seconds   feet on ball plus obl and KTC     Knee/Hip Exercises: Standing   Hip Flexion Stengthening;Left;2 sets;Knee straight;10 reps   1 set toes fwd, 1 set toes ER 3#   Lateral Step Up Both;10 reps;Hand Hold: 2;Step Height: 6"   opp hip abd   Forward Step Up Both;10 reps;Hand Hold: 2;Step Height: 6"   opp leg ext   Other Standing Knee Exercises hip 3 way BIL with red tband 10 x each      Knee/Hip Exercises: Seated   Long Arc Quad Strengthening;Left;2 sets;10 reps;Weights   with VMO ball squeeze   Long Arc Quad Weight 3 lbs.      Knee/Hip Exercises: Supine   Straight Leg Raises Strengthening;Left;2 sets;10 reps   3# with abd     Knee/Hip Exercises: Sidelying   Hip ABduction Strengthening;Left;10 reps   3#   Hip ADduction Strengthening;Left;2 sets;10  reps   3#   Other Sidelying Knee/Hip Exercises Cirlces CW and CCW 3# 10 each way                  PT Education - 08/17/20 0833    Education Details [id number]    Person(s) Educated Patient    Methods Explanation;Demonstration;Handout    Comprehension Verbalized understanding;Returned demonstration            PT Short Term Goals - 08/17/20 0839      PT SHORT TERM GOAL #1   Title Independent with initial HEP    Status Achieved             PT Long Term Goals - 08/10/20 1607      PT LONG TERM GOAL #1   Title Independent with advanced HEP    Time 2    Period Weeks    Status New    Target Date 08/24/20      PT LONG TERM GOAL #2   Title Pt will demo BLE strength at  least 4+/5 without pain    Time 8    Period Weeks    Status New    Target Date 10/05/20      PT LONG TERM GOAL #3   Title Pt will report improved standing and walking tolerance throughout work day with </=2/10 back and LLE pain.    Time 8    Period Weeks    Status New    Target Date 10/05/20      PT LONG TERM GOAL #4   Title Pt will demo minimal quadriceps tightness bilaterally    Time 8    Period Weeks    Status New    Target Date 10/05/20                 Plan - 08/17/20 0840    Clinical Impression Statement STG met and added to HEP today. pt tolerated ther ex progression well with cuing and less pain at end of session.    PT Treatment/Interventions ADLs/Self Care Home Management;Cryotherapy;Electrical Stimulation;Iontophoresis 4mg /ml Dexamethasone;Moist Heat;Gait training;Stair training;Functional mobility training;Therapeutic activities;Therapeutic exercise;Neuromuscular re-education;Manual techniques;Patient/family education;Taping;Vasopneumatic Device    PT Next Visit Plan assess and progress           Patient will benefit from skilled therapeutic intervention in order to improve the following deficits and impairments:  Abnormal gait,Difficulty walking,Increased muscle  spasms,Pain,Improper body mechanics,Decreased strength,Decreased mobility,Postural dysfunction  Visit Diagnosis: Pain in left hip  Acute pain of left knee  Muscle weakness (generalized)     Problem List Patient Active Problem List   Diagnosis Date Noted  . Hip pointer, initial encounter 07/24/2020  . Degenerative tear of medial meniscus of left knee 07/24/2020    Alexis Mccormick,ANGIE PTA 08/17/2020, 8:42 AM  [place]. [place], [place], [address] Phone: [phone]   Fax:  [phone]  Name: Alexis Mccormick MRN: [id number] Date of Birth: 06/13/1985

## 2020-08-21 ENCOUNTER — Telehealth: Payer: Self-pay | Admitting: Family Medicine

## 2020-08-21 NOTE — Telephone Encounter (Signed)
Informed of results.   Rosemarie Ax, MD [facility] 08/21/2020, 8:35 AM

## 2020-08-24 ENCOUNTER — Ambulatory Visit (INDEPENDENT_AMBULATORY_CARE_PROVIDER_SITE_OTHER): Payer: PRIVATE HEALTH INSURANCE | Admitting: Family Medicine

## 2020-08-24 ENCOUNTER — Other Ambulatory Visit: Payer: Self-pay

## 2020-08-24 ENCOUNTER — Ambulatory Visit: Payer: PRIVATE HEALTH INSURANCE

## 2020-08-24 DIAGNOSIS — M6281 Muscle weakness (generalized): Secondary | ICD-10-CM

## 2020-08-24 DIAGNOSIS — M25562 Pain in left knee: Secondary | ICD-10-CM

## 2020-08-24 DIAGNOSIS — S301XXD Contusion of abdominal wall, subsequent encounter: Secondary | ICD-10-CM | POA: Diagnosis not present

## 2020-08-24 DIAGNOSIS — M25552 Pain in left hip: Secondary | ICD-10-CM

## 2020-08-24 DIAGNOSIS — M545 Low back pain, unspecified: Secondary | ICD-10-CM

## 2020-08-24 DIAGNOSIS — M23204 Derangement of unspecified medial meniscus due to old tear or injury, left knee: Secondary | ICD-10-CM | POA: Diagnosis not present

## 2020-08-24 DIAGNOSIS — M62830 Muscle spasm of back: Secondary | ICD-10-CM

## 2020-08-24 DIAGNOSIS — M25561 Pain in right knee: Secondary | ICD-10-CM

## 2020-08-24 NOTE — Progress Notes (Signed)
  Alexis Mccormick - 35 y.o. female MRN [id number]  Date of birth: 04/12/1986  SUBJECTIVE:  Including CC & ROS.  No chief complaint on file.   Alexis Mccormick is a 35 y.o. female that is following up for her left knee and left hip pain.  Her left knee seems most painful in the morning.  The left hip occurs lateral and has popping at times.   Review of Systems See HPI   HISTORY: Past Medical, Surgical, Social, and Family History Reviewed & Updated per EMR.   Pertinent Historical Findings include:  Past Medical History:  Diagnosis Date  . Asthma     No past surgical history on file.  No family history on file.  Social History   Socioeconomic History  . Marital status: Unknown    Spouse name: Not on file  . Number of children: Not on file  . Years of education: Not on file  . Highest education level: Not on file  Occupational History  . Not on file  Tobacco Use  . Smoking status: Never Smoker  . Smokeless tobacco: Never Used  Substance and Sexual Activity  . Alcohol use: Yes    Comment: very rarely   . Drug use: No  . Sexual activity: Not on file  Other Topics Concern  . Not on file  Social History Narrative  . Not on file   Social Determinants of Health   Financial Resource Strain: Not on file  Food Insecurity: Not on file  Transportation Needs: Not on file  Physical Activity: Not on file  Stress: Not on file  Social Connections: Not on file  Intimate Partner Violence: Not on file     PHYSICAL EXAM:  VS: BP 140/84   Ht 5\' 4"  (1.626 m)   Wt 253 lb (114.8 kg)   BMI 43.43 kg/m  Physical Exam Gen: NAD, alert, cooperative with exam, well-appearing   ASSESSMENT & PLAN:   Hip pointer, subsequent encounter Has a change at the proximal portion of the hip joint which could cause some impingement since the trauma.  The ASIS on x-ray did show changes. -Counseled on home exercise therapy and supportive care. -Continue physical therapy. -Counseled on taking  ibuprofen. -Could consider injection or imaging.   Degenerative tear of medial meniscus of left knee Has ongoing pain in the medial aspect of the left knee.  Seems most aggravated the morning.  Does get improvement with ibuprofen. -Counseled on home exercise therapy and supportive care. -Continue physical therapy. -Could consider injection. - pennsaid samples

## 2020-08-24 NOTE — Assessment & Plan Note (Addendum)
Has ongoing pain in the medial aspect of the left knee.  Seems most aggravated the morning.  Does get improvement with ibuprofen. -Counseled on home exercise therapy and supportive care. -Continue physical therapy. -Could consider injection. - pennsaid samples

## 2020-08-24 NOTE — Assessment & Plan Note (Signed)
Has a change at the proximal portion of the hip joint which could cause some impingement since the trauma.  The ASIS on x-ray did show changes. -Counseled on home exercise therapy and supportive care. -Continue physical therapy. -Counseled on taking ibuprofen. -Could consider injection or imaging.

## 2020-08-24 NOTE — Therapy (Signed)
[place]. [place], [place], [address] Phone: [phone]   Fax:  [phone]  Physical Therapy Treatment  Patient Details  Name: Alexis Mccormick MRN: [id number] Date of Birth: 1985-09-30 Referring Provider (PT): Clinton Quant Date: 08/24/2020   PT End of Session - 08/24/20 1612    Visit Number 3    Number of Visits 9    Date for PT Re-Evaluation 10/05/20    PT Start Time 1530    PT Stop Time 1615    PT Time Calculation (min) 45 min    Activity Tolerance Patient tolerated treatment well    Behavior During Therapy [facility] for tasks assessed/performed           Past Medical History:  Diagnosis Date  . Asthma     History reviewed. No pertinent surgical history.  There were no vitals filed for this visit.   Subjective Assessment - 08/24/20 1530    Subjective Pt reports going to MD this morning. Noticed in XR might have some impingement pain in L hip. Given cream for knee, use heat on hip, ice on knees, and gave insoles. He wants her to come back again on 4/17.    Currently in Pain? Yes    Pain Score 5     Pain Location Knee    Pain Orientation Left;Medial    Pain Descriptors / Indicators Aching    Pain Score 6    Pain Location Hip    Pain Orientation Left;Proximal;Lateral    Pain Descriptors / Indicators --   stiffness                            [facility] Adult PT Treatment/Exercise - 08/24/20 0001      Lumbar Exercises: Supine   Bridge Non-compliant;15 reps;3 seconds   feet on ball plus obl and KTC     Lumbar Exercises: Sidelying   Clam 15 reps;3 seconds    Clam Limitations Red theraband      Knee/Hip Exercises: Standing   Lateral Step Up Both;Hand Hold: 2;Step Height: 6";15 reps   opp hip abd   Forward Step Up Both;Hand Hold: 2;Step Height: 6";15 reps   opp leg ext   SLS 2x45" with light single UE hold    Other Standing Knee Exercises Single leg deadlift small x8   hand hold  on mat table     Manual Therapy   Manual Therapy Joint mobilization;Soft tissue mobilization;Passive ROM    Joint Mobilization poserior glide to femur with knee/hip in 90/90 with slight IR/ER/flex    Soft tissue mobilization hip flexors/superior quads    Passive ROM circular glides with knee bent to 90 (lower leg resting on therapist's knee) with downward/posterior pressure to femur with CW/CCW circles starting small and increasing in size                    PT Short Term Goals - 08/17/20 0839      PT SHORT TERM GOAL #1   Title Independent with initial HEP    Status Achieved             PT Long Term Goals - 08/10/20 1607      PT LONG TERM GOAL #1   Title Independent with advanced HEP    Time 2    Period Weeks    Status New    Target Date 08/24/20  PT LONG TERM GOAL #2   Title Pt will demo BLE strength at least 4+/5 without pain    Time 8    Period Weeks    Status New    Target Date 10/05/20      PT LONG TERM GOAL #3   Title Pt will report improved standing and walking tolerance throughout work day with </=2/10 back and LLE pain.    Time 8    Period Weeks    Status New    Target Date 10/05/20      PT LONG TERM GOAL #4   Title Pt will demo minimal quadriceps tightness bilaterally    Time 8    Period Weeks    Status New    Target Date 10/05/20                 Plan - 08/24/20 1626    Clinical Impression Statement Pt tolerated tx well starting with manual therapy for fascial release around hip with report of alleviated pain (5/10 end of session post ther ex). Focused on stability of L LE with glute engagement and knee stability as well, adding SL DL with UE support as well as SLS on airex.    PT Treatment/Interventions ADLs/Self Care Home Management;Cryotherapy;Electrical Stimulation;Iontophoresis 4mg /ml Dexamethasone;Moist Heat;Gait training;Stair training;Functional mobility training;Therapeutic activities;Therapeutic exercise;Neuromuscular  re-education;Manual techniques;Patient/family education;Taping;Vasopneumatic Device    PT Next Visit Plan progress L hip and LE stability and strength, manual and modalities as indicated           Patient will benefit from skilled therapeutic intervention in order to improve the following deficits and impairments:  Abnormal gait,Difficulty walking,Increased muscle spasms,Pain,Improper body mechanics,Decreased strength,Decreased mobility,Postural dysfunction  Visit Diagnosis: Pain in left hip  Acute pain of left knee  Muscle weakness (generalized)  Acute low back pain, unspecified back pain laterality, unspecified whether sciatica present  Acute pain of right knee  Muscle spasm of back     Problem List Patient Active Problem List   Diagnosis Date Noted  . Hip pointer, subsequent encounter 07/24/2020  . Degenerative tear of medial meniscus of left knee 07/24/2020    Izell [place], PT, DPT 08/24/2020, 5:17 PM  [place]. [place], [place], [address] Phone: [phone]   Fax:  [phone]  Name: Alexis Mccormick MRN: [id number] Date of Birth: June 21, 1985

## 2020-08-24 NOTE — Patient Instructions (Signed)
Good to see you Please try heat or ice as needed  Please try pennsaid  Please try ibuprofen. Do not use with pennsaid   Please send me a message in MyChart with any questions or updates.  Please see me back in 4 weeks.   --Dr. Raeford Razor

## 2020-08-24 NOTE — Progress Notes (Signed)
Medication Samples have been provided to the patient.  Drug name: pennsaid       Strength: 2%        Qty: 2 box  LOT: [id number]  Exp.Date: 5/22  Dosing instructions: use a pea size amount to the affected area.   The patient has been instructed regarding the correct time, dose, and frequency of taking this medication, including desired effects and most common side effects.   April Manson 8:42 AM 08/24/2020

## 2020-08-29 ENCOUNTER — Encounter: Payer: Self-pay | Admitting: Physical Therapy

## 2020-08-29 ENCOUNTER — Ambulatory Visit: Payer: PRIVATE HEALTH INSURANCE | Admitting: Physical Therapy

## 2020-08-29 ENCOUNTER — Other Ambulatory Visit: Payer: Self-pay

## 2020-08-29 DIAGNOSIS — M6281 Muscle weakness (generalized): Secondary | ICD-10-CM

## 2020-08-29 DIAGNOSIS — M25562 Pain in left knee: Secondary | ICD-10-CM

## 2020-08-29 DIAGNOSIS — M25552 Pain in left hip: Secondary | ICD-10-CM

## 2020-08-29 DIAGNOSIS — M545 Low back pain, unspecified: Secondary | ICD-10-CM

## 2020-08-29 NOTE — Therapy (Signed)
[place]. [place], [place], [address] Phone: [phone]   Fax:  [phone]  Physical Therapy Treatment  Patient Details  Name: Alexis Mccormick MRN: [id number] Date of Birth: April 08, 1986 Referring Provider (PT): Clinton Quant Date: 08/29/2020   PT End of Session - 08/29/20 1416    Visit Number 3    Number of Visits 9    Date for PT Re-Evaluation 10/05/20    PT Start Time 2703    PT Stop Time 1428    PT Time Calculation (min) 43 min    Activity Tolerance Patient tolerated treatment well    Behavior During Therapy [facility] for tasks assessed/performed           Past Medical History:  Diagnosis Date  . Asthma     History reviewed. No pertinent surgical history.  There were no vitals filed for this visit.   Subjective Assessment - 08/29/20 1348    Subjective "Much better" has been able to sleep.    Pertinent History R knee pain that has worsened post accident with depending on that sideputting more weight on that side - "popping", Asthma - has emergency inhaler    Currently in Pain? Yes    Pain Score 2     Pain Location Knee    Pain Orientation Left                             [facility] Adult PT Treatment/Exercise - 08/29/20 0001      Knee/Hip Exercises: Aerobic   Recumbent Bike L2 x 3 min    Nustep L3 x4 min      Knee/Hip Exercises: Standing   Heel Raises Both;2 sets;15 reps;2 seconds    Lateral Step Up Both;10 reps;Hand Hold: 0;Step Height: 6";2 sets;5 reps;Step Height: 8"    Forward Step Up Both;Step Height: 6";Hand Hold: 0;10 reps    Walking with Sports Cord 40lb 4way x 3 each    Other Standing Knee Exercises Hip Abd & ext x10 each      Knee/Hip Exercises: Seated   Long Arc Quad Strengthening;Left;2 sets;Weights;15 reps    Long Arc Quad Weight 3 lbs.    Hamstring Curl Left;2 sets;15 reps    Hamstring Limitations green Tband    Sit to Sand 2 sets;10 reps;without UE support    LE on airex holding yellow ball                   PT Short Term Goals - 08/17/20 0839      PT SHORT TERM GOAL #1   Title Independent with initial HEP    Status Achieved             PT Long Term Goals - 08/10/20 1607      PT LONG TERM GOAL #1   Title Independent with advanced HEP    Time 2    Period Weeks    Status New    Target Date 08/24/20      PT LONG TERM GOAL #2   Title Pt will demo BLE strength at least 4+/5 without pain    Time 8    Period Weeks    Status New    Target Date 10/05/20      PT LONG TERM GOAL #3   Title Pt will report improved standing and walking tolerance throughout work day with </=2/10 back and LLE pain.  Time 8    Period Weeks    Status New    Target Date 10/05/20      PT LONG TERM GOAL #4   Title Pt will demo minimal quadriceps tightness bilaterally    Time 8    Period Weeks    Status New    Target Date 10/05/20                 Plan - 08/29/20 1417    Clinical Impression Statement Improvement reported overall with decrease pain. Progressed to 8 ich step up without issue. Tactile cues needed with standing hip and abd to prevent postural leaning. Cues needed not to allow LE to push against mat with sit to stands.    Personal Factors and Comorbidities Age;Time since onset of injury/illness/exacerbation;Profession    Examination-Activity Limitations Locomotion Level;Stairs    Examination-Participation Restrictions Community Activity;Occupation    Stability/Clinical Decision Making Stable/Uncomplicated    Rehab Potential Good    PT Duration 8 weeks    PT Treatment/Interventions ADLs/Self Care Home Management;Cryotherapy;Electrical Stimulation;Iontophoresis 4mg /ml Dexamethasone;Moist Heat;Gait training;Stair training;Functional mobility training;Therapeutic activities;Therapeutic exercise;Neuromuscular re-education;Manual techniques;Patient/family education;Taping;Vasopneumatic Device    PT Next Visit Plan progress L hip  and LE stability and strength, manual and modalities as indicated           Patient will benefit from skilled therapeutic intervention in order to improve the following deficits and impairments:  Abnormal gait,Difficulty walking,Increased muscle spasms,Pain,Improper body mechanics,Decreased strength,Decreased mobility,Postural dysfunction  Visit Diagnosis: Acute pain of left knee  Muscle weakness (generalized)  Acute low back pain, unspecified back pain laterality, unspecified whether sciatica present  Pain in left hip     Problem List Patient Active Problem List   Diagnosis Date Noted  . Hip pointer, subsequent encounter 07/24/2020  . Degenerative tear of medial meniscus of left knee 07/24/2020    Scot Jun, PTA 08/29/2020, 2:23 PM  [place]. [place], [place], [address] Phone: [phone]   Fax:  [phone]  Name: Laneta Guerin MRN: [id number] Date of Birth: Jul 19, 1985

## 2020-09-07 ENCOUNTER — Encounter: Payer: Self-pay | Admitting: Physical Therapy

## 2020-09-07 ENCOUNTER — Other Ambulatory Visit: Payer: Self-pay

## 2020-09-07 ENCOUNTER — Ambulatory Visit: Payer: PRIVATE HEALTH INSURANCE | Admitting: Physical Therapy

## 2020-09-07 DIAGNOSIS — M545 Low back pain, unspecified: Secondary | ICD-10-CM

## 2020-09-07 DIAGNOSIS — M25552 Pain in left hip: Secondary | ICD-10-CM | POA: Diagnosis not present

## 2020-09-07 DIAGNOSIS — M6281 Muscle weakness (generalized): Secondary | ICD-10-CM

## 2020-09-07 DIAGNOSIS — M25561 Pain in right knee: Secondary | ICD-10-CM

## 2020-09-07 DIAGNOSIS — M25562 Pain in left knee: Secondary | ICD-10-CM

## 2020-09-07 DIAGNOSIS — M62830 Muscle spasm of back: Secondary | ICD-10-CM

## 2020-09-07 NOTE — Therapy (Signed)
[place]. [place], [place], [address] Phone: [phone]   Fax:  [phone]  Physical Therapy Treatment  Patient Details  Name: Alexis Mccormick MRN: [id number] Date of Birth: Nov 06, 1985 Referring Provider (PT): Clinton Quant Date: 09/07/2020   PT End of Session - 09/07/20 0852    Visit Number 4    Number of Visits 9    Date for PT Re-Evaluation 10/05/20    PT Start Time 0801    PT Stop Time 0843    PT Time Calculation (min) 42 min    Activity Tolerance Patient tolerated treatment well    Behavior During Therapy [facility] for tasks assessed/performed           Past Medical History:  Diagnosis Date  . Asthma     History reviewed. No pertinent surgical history.  There were no vitals filed for this visit.   Subjective Assessment - 09/07/20 0803    Subjective Pt reports doing much better overall; states no pain this morning just occasional "twinge" in L hip with movement. States "nothing like before".    Currently in Pain? No/denies    Pain Score 0-No pain    Pain Location Knee    Pain Orientation Left    Pain Score 0    Pain Location Hip    Pain Orientation Left                             [facility] Adult PT Treatment/Exercise - 09/07/20 0001      Lumbar Exercises: Stretches   Piriformis Stretch Right;Left;20 seconds;2 reps    Piriformis Stretch Limitations seated    Gastroc Stretch Right;Left;1 rep;20 seconds    Other Lumbar Stretch Exercise seated fwd/lat flexion with exercise ball x3 5" hold      Lumbar Exercises: Standing   Heel Raises 15 reps      Knee/Hip Exercises: Aerobic   Recumbent Bike L2 x 5 min    Nustep L5 x 6 min      Knee/Hip Exercises: Machines for Strengthening   Cybex Knee Extension 5# 2x10 BLE    Cybex Knee Flexion 25# 2x10 BLE    Cybex Leg Press 20#, 30# 2x10 BLE      Knee/Hip Exercises: Standing   Walking with Sports Cord 40lb 4way x 4 each       Knee/Hip Exercises: Seated   Sit to Sand 2 sets;10 reps;without UE support   with yellow ball chest press                   PT Short Term Goals - 08/17/20 5400      PT SHORT TERM GOAL #1   Title Independent with initial HEP    Status Achieved             PT Long Term Goals - 09/07/20 8676      PT LONG TERM GOAL #1   Title Independent with advanced HEP    Time 2    Period Weeks    Status On-going      PT LONG TERM GOAL #2   Title Pt will demo BLE strength at least 4+/5 without pain    Time 8    Period Weeks    Status On-going      PT LONG TERM GOAL #3   Title Pt will report improved standing and walking tolerance throughout work day with </=2/10 back  and LLE pain.    Baseline Pt reports <2/10 pain throughout the work day    Time 8    Period Weeks    Status Achieved      PT LONG TERM GOAL #4   Title Pt will demo minimal quadriceps tightness bilaterally    Time 8    Period Weeks    Status On-going                 Plan - 09/07/20 0853    Clinical Impression Statement Pt making progress toward LTGs; reports able to stand/walk at work without increase in pain past 2/10. Pt tolerated progression to machine interventions with no increase in knee or LBP. Cues for full ROM with knee ext/flex exercise. Continue to progress functional strengthening and tolerance to exercise.    PT Treatment/Interventions ADLs/Self Care Home Management;Cryotherapy;Electrical Stimulation;Iontophoresis 4mg /ml Dexamethasone;Moist Heat;Gait training;Stair training;Functional mobility training;Therapeutic activities;Therapeutic exercise;Neuromuscular re-education;Manual techniques;Patient/family education;Taping;Vasopneumatic Device    PT Next Visit Plan progress L hip and LE stability and strength, manual and modalities as indicated    Consulted and Agree with Plan of Care Patient           Patient will benefit from skilled therapeutic intervention in order to improve the  following deficits and impairments:  Abnormal gait,Difficulty walking,Increased muscle spasms,Pain,Improper body mechanics,Decreased strength,Decreased mobility,Postural dysfunction  Visit Diagnosis: Acute pain of left knee  Muscle weakness (generalized)  Acute low back pain, unspecified back pain laterality, unspecified whether sciatica present  Pain in left hip  Acute pain of right knee  Muscle spasm of back     Problem List Patient Active Problem List   Diagnosis Date Noted  . Hip pointer, subsequent encounter 07/24/2020  . Degenerative tear of medial meniscus of left knee 07/24/2020   Amador Cunas, PT, DPT Donald Prose Theophil Thivierge 09/07/2020, 9:12 AM  [place]. [place], [place], [address] Phone: [phone]   Fax:  [phone]  Name: Alexis Mccormick MRN: [id number] Date of Birth: Oct 15, 1985

## 2020-09-14 ENCOUNTER — Ambulatory Visit: Payer: 59 | Attending: Family Medicine

## 2020-09-14 ENCOUNTER — Other Ambulatory Visit: Payer: Self-pay

## 2020-09-14 DIAGNOSIS — M545 Low back pain, unspecified: Secondary | ICD-10-CM | POA: Insufficient documentation

## 2020-09-14 DIAGNOSIS — M25552 Pain in left hip: Secondary | ICD-10-CM | POA: Insufficient documentation

## 2020-09-14 DIAGNOSIS — M6281 Muscle weakness (generalized): Secondary | ICD-10-CM | POA: Diagnosis present

## 2020-09-14 DIAGNOSIS — M62830 Muscle spasm of back: Secondary | ICD-10-CM | POA: Diagnosis present

## 2020-09-14 DIAGNOSIS — M25561 Pain in right knee: Secondary | ICD-10-CM | POA: Insufficient documentation

## 2020-09-14 DIAGNOSIS — M25562 Pain in left knee: Secondary | ICD-10-CM | POA: Insufficient documentation

## 2020-09-14 NOTE — Therapy (Signed)
[place]. [place], [place], [address] Phone: [phone]   Fax:  [phone]  Physical Therapy Treatment  Patient Details  Name: Alexis Mccormick MRN: [id number] Date of Birth: 09/13/1985 Referring Provider (PT): Clinton Quant Date: 09/14/2020   PT End of Session - 09/14/20 0805    Visit Number 5    Number of Visits 9    Date for PT Re-Evaluation 10/05/20    PT Start Time 0800    PT Stop Time 0843    PT Time Calculation (min) 43 min    Activity Tolerance Patient tolerated treatment well    Behavior During Therapy [facility] for tasks assessed/performed           Past Medical History:  Diagnosis Date  . Asthma     History reviewed. No pertinent surgical history.  There were no vitals filed for this visit.   Subjective Assessment - 09/14/20 0803    Subjective Tired today. Did not like leg press from last visit - lot of pain after. Hip is just stiff today    Patient Stated Goals decrease pain, back to PLOF    Currently in Pain? No/denies             [facility] Adult PT Treatment/Exercise - 09/14/20 0001      Lumbar Exercises: Stretches   Piriformis Stretch Right;Left;20 seconds;2 reps    Piriformis Stretch Limitations seated    Gastroc Stretch Right;Left;1 rep;20 seconds          Lumbar Exercises: Standing       Other Standing Lumbar Exercises Standing combination hip flexion, ABD, Extension 2x 10 B. with BUE support, 2#AW      Knee/Hip Exercises: Aerobic   Recumbent Bike L2 x 4 min    Nustep L5 x 5 min      Knee/Hip Exercises: Machines for Strengthening   Cybex Knee Extension 5# 2x10 BLE    Cybex Knee Flexion 25# 2x10 BLE          Knee/Hip Exercises: Standing   Walking with Sports Cord 40lb 4way x 4 each      Knee/Hip Exercises: Seated   Sit to Sand 2 sets;10 reps;without UE support   with yellow ball chest press         Lateral step downs x10 B 4" step          PT Short Term  Goals - 08/17/20 1497      PT SHORT TERM GOAL #1   Title Independent with initial HEP    Status Achieved             PT Long Term Goals - 09/07/20 0263      PT LONG TERM GOAL #1   Title Independent with advanced HEP    Time 2    Period Weeks    Status On-going      PT LONG TERM GOAL #2   Title Pt will demo BLE strength at least 4+/5 without pain    Time 8    Period Weeks    Status On-going      PT LONG TERM GOAL #3   Title Pt will report improved standing and walking tolerance throughout work day with </=2/10 back and LLE pain.    Baseline Pt reports <2/10 pain throughout the work day    Time 8    Period Weeks    Status Achieved      PT LONG TERM GOAL #4  Title Pt will demo minimal quadriceps tightness bilaterally    Time 8    Period Weeks    Status On-going                 Plan - 09/14/20 0806    Clinical Impression Statement Pt making progress toward LTGs; reports able to stand/walk at work without increase in pain past 2/10. Most difficulty is more with climbing ladder at work but she reports she is not doing that often at the moment. Continued seated machines except for leg press today as pt deferred it reporting she had increased pain from in after last session. Added standing 3 way hip exercises with good tolerance today. Continue to progress functional strengthening and tolerance to exercise.    Personal Factors and Comorbidities Age;Time since onset of injury/illness/exacerbation;Profession    Examination-Activity Limitations Locomotion Level;Stairs    Examination-Participation Restrictions Community Activity;Occupation    Rehab Potential Good    PT Frequency 1x / week    PT Duration 8 weeks    PT Treatment/Interventions ADLs/Self Care Home Management;Cryotherapy;Electrical Stimulation;Iontophoresis 4mg /ml Dexamethasone;Moist Heat;Gait training;Stair training;Functional mobility training;Therapeutic activities;Therapeutic exercise;Neuromuscular  re-education;Manual techniques;Patient/family education;Taping;Vasopneumatic Device    PT Next Visit Plan progress L hip and LE stability and strength, manual and modalities as indicated. Follow up regarding appointment with Dr Raeford Razor next visit    Consulted and Agree with Plan of Care Patient           Patient will benefit from skilled therapeutic intervention in order to improve the following deficits and impairments:  Abnormal gait,Difficulty walking,Increased muscle spasms,Pain,Improper body mechanics,Decreased strength,Decreased mobility,Postural dysfunction  Visit Diagnosis: Acute pain of left knee  Muscle weakness (generalized)  Acute low back pain, unspecified back pain laterality, unspecified whether sciatica present  Pain in left hip  Acute pain of right knee  Muscle spasm of back     Problem List Patient Active Problem List   Diagnosis Date Noted  . Hip pointer, subsequent encounter 07/24/2020  . Degenerative tear of medial meniscus of left knee 07/24/2020    Hall Busing, PT, DPT 09/14/2020, 8:46 AM  [place]. [place], [place], [address] Phone: [phone]   Fax:  [phone]  Name: Alexis Mccormick MRN: [id number] Date of Birth: 05-05-1986

## 2020-09-21 ENCOUNTER — Other Ambulatory Visit: Payer: Self-pay

## 2020-09-21 ENCOUNTER — Ambulatory Visit: Payer: 59 | Admitting: Physical Therapy

## 2020-09-21 ENCOUNTER — Encounter: Payer: Self-pay | Admitting: Family Medicine

## 2020-09-21 ENCOUNTER — Ambulatory Visit (INDEPENDENT_AMBULATORY_CARE_PROVIDER_SITE_OTHER): Payer: PRIVATE HEALTH INSURANCE | Admitting: Family Medicine

## 2020-09-21 VITALS — BP 120/82 | Ht 64.0 in | Wt 253.0 lb

## 2020-09-21 DIAGNOSIS — M7050 Other bursitis of knee, unspecified knee: Secondary | ICD-10-CM | POA: Insufficient documentation

## 2020-09-21 DIAGNOSIS — M23204 Derangement of unspecified medial meniscus due to old tear or injury, left knee: Secondary | ICD-10-CM | POA: Diagnosis not present

## 2020-09-21 MED ORDER — MELOXICAM 7.5 MG PO TABS: 7.5000 mg | ORAL_TABLET | Freq: Two times a day (BID) | ORAL | 1 refills | Status: DC | PRN

## 2020-09-21 NOTE — Assessment & Plan Note (Signed)
Continues to get some improvement with physical therapy.  Had some exacerbation with repetitive bending and squatting at work. -Counseled on home exercise therapy and supportive care. -Mobic. -Continue physical therapy -Could consider injection or further imaging.

## 2020-09-21 NOTE — Assessment & Plan Note (Signed)
May have a component of bursitis as she does have some changes at the [employer].  [place] could be moving appropriately that has caused her bursitis repetitive impingement work. - counseled on home exercise therapy and supportive  - mobic  - could consider injection

## 2020-09-21 NOTE — Progress Notes (Signed)
  Alexis Mccormick - 35 y.o. female MRN [id number]  Date of birth: 13-Jul-1985  SUBJECTIVE:  Including CC & ROS.  No chief complaint on file.   Alexis Mccormick is a 35 y.o. female that is following up for her knee pain and presenting with new left leg pain.  Her pain seems exacerbated with bending at work.  She is getting some improvement with physical therapy.  Having some pain in the medial tibial area at the insertion of the pes anserine.   Review of Systems See HPI   HISTORY: Past Medical, Surgical, Social, and Family History Reviewed & Updated per EMR.   Pertinent Historical Findings include:  Past Medical History:  Diagnosis Date  . Asthma     No past surgical history on file.  No family history on file.  Social History   Socioeconomic History  . Marital status: Unknown    Spouse name: Not on file  . Number of children: Not on file  . Years of education: Not on file  . Highest education level: Not on file  Occupational History  . Not on file  Tobacco Use  . Smoking status: Never Smoker  . Smokeless tobacco: Never Used  Substance and Sexual Activity  . Alcohol use: Yes    Comment: very rarely   . Drug use: No  . Sexual activity: Not on file  Other Topics Concern  . Not on file  Social History Narrative  . Not on file   Social Determinants of Health   Financial Resource Strain: Not on file  Food Insecurity: Not on file  Transportation Needs: Not on file  Physical Activity: Not on file  Stress: Not on file  Social Connections: Not on file  Intimate Partner Violence: Not on file     PHYSICAL EXAM:  VS: BP 120/82 (BP Location: Left Arm, Patient Position: Sitting, Cuff Size: Large)   Ht 5\' 4"  (1.626 m)   Wt 253 lb (114.8 kg)   BMI 43.43 kg/m  Physical Exam Gen: NAD, alert, cooperative with exam, well-appearing MSK:  Left leg: Normal range of motion. Tenderness palpation of the pes anserine region. Normal strength resistance. Neurovascular  intact     ASSESSMENT & PLAN:   Degenerative tear of medial meniscus of left knee Continues to get some improvement with physical therapy.  Had some exacerbation with repetitive bending and squatting at work. -Counseled on home exercise therapy and supportive care. -Mobic. -Continue physical therapy -Could consider injection or further imaging.  Pes anserine bursitis May have a component of bursitis as she does have some changes at the [employer].  [place] could be moving appropriately that has caused her bursitis repetitive impingement work. - counseled on home exercise therapy and supportive  - mobic  - could consider injection

## 2020-09-21 NOTE — Patient Instructions (Signed)
Good to see you  Please try ice  Please use the mobic as needed  Please try the exercises  Please send me a message in MyChart with any questions or updates.  Please see me back in 4 weeks.   --Dr. Raeford Razor

## 2020-09-28 ENCOUNTER — Other Ambulatory Visit: Payer: Self-pay

## 2020-09-28 ENCOUNTER — Encounter: Payer: Self-pay | Admitting: Physical Therapy

## 2020-09-28 ENCOUNTER — Ambulatory Visit: Payer: 59 | Admitting: Physical Therapy

## 2020-09-28 DIAGNOSIS — M6281 Muscle weakness (generalized): Secondary | ICD-10-CM

## 2020-09-28 DIAGNOSIS — M25562 Pain in left knee: Secondary | ICD-10-CM | POA: Diagnosis not present

## 2020-09-28 DIAGNOSIS — M545 Low back pain, unspecified: Secondary | ICD-10-CM

## 2020-09-28 DIAGNOSIS — M25552 Pain in left hip: Secondary | ICD-10-CM

## 2020-09-28 NOTE — Therapy (Signed)
[place]. [place], [place], [address] Phone: [phone]   Fax:  [phone]  Physical Therapy Treatment  Patient Details  Name: Alexis Mccormick MRN: [id number] Date of Birth: [birth year] Referring Provider (PT): Clinton Quant Date: 09/28/2020   PT End of Session - 09/28/20 0840    Visit Number 6    Number of Visits 9    Date for PT Re-Evaluation 10/05/20    PT Start Time 0801    PT Stop Time 0841    PT Time Calculation (min) 40 min    Activity Tolerance Patient tolerated treatment well    Behavior During Therapy [facility] for tasks assessed/performed           Past Medical History:  Diagnosis Date  . Asthma     History reviewed. No pertinent surgical history.  There were no vitals filed for this visit.   Subjective Assessment - 09/28/20 0804    Subjective Pt reports feeling better today with minimal pain. Does not want to do leg press as she had a lot of pain after.    Currently in Pain? Yes    Pain Score 1     Pain Location Knee    Pain Orientation Left              [facility] PT Assessment - 09/28/20 0001      Assessment   Next MD Visit 10/26/2020                         [facility] Adult PT Treatment/Exercise - 09/28/20 0001      Knee/Hip Exercises: Stretches   Piriformis Stretch Both;2 reps;20 seconds    Piriformis Stretch Limitations seated    Gastroc Stretch Both;1 rep;20 seconds    Soleus Stretch Both;1 rep;20 seconds      Knee/Hip Exercises: Aerobic   Recumbent Bike L2 x 6 min      Knee/Hip Exercises: Machines for Strengthening   Cybex Knee Extension 5# 2x10 BLE    Cybex Knee Flexion 25# 2x10 BLE      Knee/Hip Exercises: Standing   Heel Raises Both;1 set;15 reps    Lateral Step Up Both;1 set;10 reps;Hand Hold: 0;Step Height: 6"    Forward Step Up Both;1 set;10 reps;Hand Hold: 0;Step Height: 6"    Walking with Sports Cord 40lb 4way x 4 each; lateral/fwd taps x10 B       Knee/Hip Exercises: Seated   Sit to Sand 2 sets;10 reps;without UE support   with yellow ball chest press                   PT Short Term Goals - 08/17/20 0839      PT SHORT TERM GOAL #1   Title Independent with initial HEP    Status Achieved             PT Long Term Goals - 09/07/20 5631      PT LONG TERM GOAL #1   Title Independent with advanced HEP    Time 2    Period Weeks    Status On-going      PT LONG TERM GOAL #2   Title Pt will demo BLE strength at least 4+/5 without pain    Time 8    Period Weeks    Status On-going      PT LONG TERM GOAL #3   Title Pt will report improved standing and walking  tolerance throughout work day with </=2/10 back and LLE pain.    Baseline Pt reports <2/10 pain throughout the work day    Time 8    Period Weeks    Status Achieved      PT LONG TERM GOAL #4   Title Pt will demo minimal quadriceps tightness bilaterally    Time 8    Period Weeks    Status On-going                 Plan - 09/28/20 0841    Clinical Impression Statement Pt had MD appt; states that he wrote her a note to avoid excessive squatting and climbing ladder at work d/t new lower LLE pain. She is feeling better overall today. Avoided leg press machine per pt request. Did well with all other maching and standing interventions with no c/o increased pain. Discussed continuance of PT for a few more weeks at least until next follow up with Dr. Raeford Razor, 5/19 with pt agreement. Continue to progress functional strengthening and tolerance to exercise.    PT Treatment/Interventions ADLs/Self Care Home Management;Cryotherapy;Electrical Stimulation;Iontophoresis 4mg /ml Dexamethasone;Moist Heat;Gait training;Stair training;Functional mobility training;Therapeutic activities;Therapeutic exercise;Neuromuscular re-education;Manual techniques;Patient/family education;Taping;Vasopneumatic Device    PT Next Visit Plan progress L hip and LE stability and strength,  manual and modalities as indicated.    Consulted and Agree with Plan of Care Patient           Patient will benefit from skilled therapeutic intervention in order to improve the following deficits and impairments:  Abnormal gait,Difficulty walking,Increased muscle spasms,Pain,Improper body mechanics,Decreased strength,Decreased mobility,Postural dysfunction  Visit Diagnosis: Acute pain of left knee  Muscle weakness (generalized)  Acute low back pain, unspecified back pain laterality, unspecified whether sciatica present  Pain in left hip     Problem List Patient Active Problem List   Diagnosis Date Noted  . Pes anserine bursitis 09/21/2020  . Hip pointer, subsequent encounter 07/24/2020  . Degenerative tear of medial meniscus of left knee 07/24/2020   Amador Cunas, PT, DPT Donald Prose Dailan Pfalzgraf 09/28/2020, 8:44 AM  [place]. [place], [place], [address] Phone: [phone]   Fax:  [phone]  Name: Alexis Mccormick MRN: [id number] Date of Birth: 1985/12/09

## 2020-10-12 ENCOUNTER — Ambulatory Visit: Payer: 59 | Attending: Family Medicine | Admitting: Physical Therapy

## 2020-10-12 ENCOUNTER — Encounter: Payer: Self-pay | Admitting: Physical Therapy

## 2020-10-12 ENCOUNTER — Other Ambulatory Visit: Payer: Self-pay

## 2020-10-12 DIAGNOSIS — M6281 Muscle weakness (generalized): Secondary | ICD-10-CM | POA: Insufficient documentation

## 2020-10-12 DIAGNOSIS — M25552 Pain in left hip: Secondary | ICD-10-CM | POA: Insufficient documentation

## 2020-10-12 DIAGNOSIS — M545 Low back pain, unspecified: Secondary | ICD-10-CM | POA: Insufficient documentation

## 2020-10-12 DIAGNOSIS — M25562 Pain in left knee: Secondary | ICD-10-CM | POA: Diagnosis present

## 2020-10-12 NOTE — Therapy (Signed)
[place]. [place], [place], [address] Phone: [phone]   Fax:  [phone]  Physical Therapy Treatment  Patient Details  Name: Alexis Mccormick MRN: [id number] Date of Birth: 25-Aug-1985 Referring Provider (PT): Clinton Quant Date: 10/12/2020   PT End of Session - 10/12/20 1141    Visit Number 7    Number of Visits 9    Date for PT Re-Evaluation 11/12/20    PT Start Time 1056    PT Stop Time 1140    PT Time Calculation (min) 44 min    Activity Tolerance Patient tolerated treatment well    Behavior During Therapy [facility] for tasks assessed/performed           Past Medical History:  Diagnosis Date  . Asthma     History reviewed. No pertinent surgical history.  There were no vitals filed for this visit.   Subjective Assessment - 10/12/20 1057    Subjective Doing all right, no pain, Vacation last week    Currently in Pain? No/denies                             [facility] Adult PT Treatment/Exercise - 10/12/20 0001      Lumbar Exercises: Standing   Other Standing Lumbar Exercises LLE LS BL 5lb 2x10      Knee/Hip Exercises: Aerobic   Recumbent Bike L225 x 6 min      Knee/Hip Exercises: Machines for Strengthening   Cybex Knee Extension 10# 2x10 BLE, LEE 5lb 2x10    Cybex Knee Flexion 35# 2x10 BLE, LLE 20lb 2x10      Knee/Hip Exercises: Standing   Heel Raises Both;15 reps;2 sets    Lateral Step Up Both;1 set;10 reps;Hand Hold: 0;Step Height: 6"    Forward Step Up Both;1 set;10 reps;Hand Hold: 0;Step Height: 8"    Walking with Sports Cord 40lb Side step over .5 foam roll x 10 each    Other Standing Knee Exercises Split Squats RFE 3x5      Knee/Hip Exercises: Seated   Sit to Sand 10 reps;without UE support;3 sets   10b dumbbell                   PT Short Term Goals - 08/17/20 0839      PT SHORT TERM GOAL #1   Title Independent with initial HEP    Status Achieved              PT Long Term Goals - 10/12/20 1147      PT LONG TERM GOAL #1   Title Independent with advanced HEP    Status Partially Met      PT LONG TERM GOAL #2   Title Pt will demo BLE strength at least 4+/5 without pain    Status Partially Met      PT LONG TERM GOAL #3   Title Pt will report improved standing and walking tolerance throughout work day with </=2/10 back and LLE pain.    Status Achieved      PT LONG TERM GOAL #4   Title Pt will demo minimal quadriceps tightness bilaterally    Status Partially Met                 Plan - 10/12/20 1144    Clinical Impression Statement Pt return to PT after vacation, she reports improvement with decrease pain. Again she wanted to  avoid leg press interventions. Added single leg dead lifts and split squats without issues, only weakness with split squats. Pt voiced that she is to avoid climbing ladders and excessive squatting at work. Pt states she has to more Apt.    Personal Factors and Comorbidities Age;Time since onset of injury/illness/exacerbation;Profession    Examination-Activity Limitations Locomotion Level;Stairs    Examination-Participation Restrictions Community Activity;Occupation    Stability/Clinical Decision Making Stable/Uncomplicated    Rehab Potential Good    PT Frequency 1x / week    PT Duration 8 weeks    PT Treatment/Interventions ADLs/Self Care Home Management;Cryotherapy;Electrical Stimulation;Iontophoresis 19m/ml Dexamethasone;Moist Heat;Gait training;Stair training;Functional mobility training;Therapeutic activities;Therapeutic exercise;Neuromuscular re-education;Manual techniques;Patient/family education;Taping;Vasopneumatic Device    PT Next Visit Plan progress L hip and LE stability and strength, manual and modalities as indicated.           Patient will benefit from skilled therapeutic intervention in order to improve the following deficits and impairments:  Abnormal gait,Difficulty walking,Increased  muscle spasms,Pain,Improper body mechanics,Decreased strength,Decreased mobility,Postural dysfunction  Visit Diagnosis: Acute pain of left knee  Muscle weakness (generalized)  Acute low back pain, unspecified back pain laterality, unspecified whether sciatica present     Problem List Patient Active Problem List   Diagnosis Date Noted  . Pes anserine bursitis 09/21/2020  . Hip pointer, subsequent encounter 07/24/2020  . Degenerative tear of medial meniscus of left knee 07/24/2020    RScot Jun5/10/2020, 11:48 AM  [place] [address]Phone: [phone]  Fax:  [phone] Name: SMarga GramajoMRN: [id number] of Birth: [birth year]

## 2020-10-19 ENCOUNTER — Encounter: Payer: Self-pay | Admitting: Physical Therapy

## 2020-10-19 ENCOUNTER — Other Ambulatory Visit: Payer: Self-pay

## 2020-10-19 ENCOUNTER — Ambulatory Visit: Payer: 59 | Admitting: Physical Therapy

## 2020-10-19 DIAGNOSIS — M25562 Pain in left knee: Secondary | ICD-10-CM | POA: Diagnosis not present

## 2020-10-19 DIAGNOSIS — M6281 Muscle weakness (generalized): Secondary | ICD-10-CM

## 2020-10-19 DIAGNOSIS — M25552 Pain in left hip: Secondary | ICD-10-CM

## 2020-10-19 NOTE — Therapy (Signed)
[place]. [place], [place], [address] Phone: [phone]   Fax:  [phone]  Physical Therapy Treatment  Patient Details  Name: Alexis Mccormick MRN: [id number] Date of Birth: 1985/12/08 Referring Provider (PT): Clinton Quant Date: 10/19/2020   PT End of Session - 10/19/20 0926    Visit Number 8    Date for PT Re-Evaluation 11/12/20    PT Start Time 0845    PT Stop Time 0926    PT Time Calculation (min) 41 min    Behavior During Therapy [facility] for tasks assessed/performed           Past Medical History:  Diagnosis Date  . Asthma     History reviewed. No pertinent surgical history.  There were no vitals filed for this visit.   Subjective Assessment - 10/19/20 0847    Subjective "ok" No new issues    Pertinent History R knee pain that has worsened post accident with depending on that sideputting more weight on that side - "popping", Asthma - has emergency inhaler    Currently in Pain? No/denies                             [facility] Adult PT Treatment/Exercise - 10/19/20 0001      Lumbar Exercises: Standing   Other Standing Lumbar Exercises LLE SL BL 7lb 2x10      Lumbar Exercises: Seated   Sit to Stand 10 reps   x3 holding 20lb dumbbell     Knee/Hip Exercises: Aerobic   Recumbent Bike L4 x6 min      Knee/Hip Exercises: Machines for Strengthening   Cybex Knee Extension 15# 2x10 BLE, LEE 10lb 2x10    Cybex Knee Flexion 35# 2x15 BLE, LLE 20lb 2x10      Knee/Hip Exercises: Standing   Heel Raises Both;15 reps;2 sets    Forward Step Up Both;2 sets;10 reps;Hand Hold: 0;Step Height: 8"   Runners step up                   PT Short Term Goals - 08/17/20 0839      PT SHORT TERM GOAL #1   Title Independent with initial HEP    Status Achieved             PT Long Term Goals - 10/12/20 1147      PT LONG TERM GOAL #1   Title Independent with advanced HEP    Status  Partially Met      PT LONG TERM GOAL #2   Title Pt will demo BLE strength at least 4+/5 without pain    Status Partially Met      PT LONG TERM GOAL #3   Title Pt will report improved standing and walking tolerance throughout work day with </=2/10 back and LLE pain.    Status Achieved      PT LONG TERM GOAL #4   Title Pt will demo minimal quadriceps tightness bilaterally    Status Partially Met                 Plan - 10/19/20 0926    Clinical Impression Statement Pt did well overall, added runners alternating step up without issues completing, but reported some difficulty performing the interventions. Cues needed to keep hips square with single leg dead lifts. Increase resistance tolerated with leg curls and extensions. Reports being out of breath with sit  to stands.    Personal Factors and Comorbidities Age;Time since onset of injury/illness/exacerbation;Profession    Examination-Activity Limitations Locomotion Level;Stairs    Examination-Participation Restrictions Community Activity;Occupation    Stability/Clinical Decision Making Stable/Uncomplicated    Rehab Potential Good    PT Frequency 1x / week    PT Duration 8 weeks    PT Treatment/Interventions ADLs/Self Care Home Management;Cryotherapy;Electrical Stimulation;Iontophoresis 4mg /ml Dexamethasone;Moist Heat;Gait training;Stair training;Functional mobility training;Therapeutic activities;Therapeutic exercise;Neuromuscular re-education;Manual techniques;Patient/family education;Taping;Vasopneumatic Device    PT Next Visit Plan progress L hip and LE stability and strength, manual and modalities as indicated.           Patient will benefit from skilled therapeutic intervention in order to improve the following deficits and impairments:  Abnormal gait,Difficulty walking,Increased muscle spasms,Pain,Improper body mechanics,Decreased strength,Decreased mobility,Postural dysfunction  Visit Diagnosis: Pain in left hip  Muscle  weakness (generalized)  Acute pain of left knee     Problem List Patient Active Problem List   Diagnosis Date Noted  . Pes anserine bursitis 09/21/2020  . Hip pointer, subsequent encounter 07/24/2020  . Degenerative tear of medial meniscus of left knee 07/24/2020    Scot Jun 10/19/2020, 9:29 AM  [place]. [place], [place], [address] Phone: [phone]   Fax:  [phone]  Name: Alexis Mccormick MRN: [id number] Date of Birth: 19-Dec-1985

## 2020-10-26 ENCOUNTER — Other Ambulatory Visit: Payer: Self-pay

## 2020-10-26 ENCOUNTER — Encounter: Payer: Self-pay | Admitting: Family Medicine

## 2020-10-26 ENCOUNTER — Ambulatory Visit (INDEPENDENT_AMBULATORY_CARE_PROVIDER_SITE_OTHER): Payer: PRIVATE HEALTH INSURANCE | Admitting: Family Medicine

## 2020-10-26 VITALS — BP 118/80 | Ht 64.0 in | Wt 253.0 lb

## 2020-10-26 DIAGNOSIS — M24852 Other specific joint derangements of left hip, not elsewhere classified: Secondary | ICD-10-CM

## 2020-10-26 DIAGNOSIS — M722 Plantar fascial fibromatosis: Secondary | ICD-10-CM

## 2020-10-26 DIAGNOSIS — M23204 Derangement of unspecified medial meniscus due to old tear or injury, left knee: Secondary | ICD-10-CM

## 2020-10-26 NOTE — Assessment & Plan Note (Signed)
Slowly continues to get improvement.  Pain is intermittent in nature. -Counseled on home exercise therapy and supportive care. -Could consider MRI or injection.

## 2020-10-26 NOTE — Patient Instructions (Signed)
Good to see you Please try the strap  Please try the exercises   Please send me a message in MyChart with any questions or updates.  Please see [place] back as needed.   --Dr. Raeford Razor

## 2020-10-26 NOTE — Progress Notes (Signed)
  Alexis Mccormick - 35 y.o. female MRN [id number]  Date of birth: 04-Apr-1986  SUBJECTIVE:  Including CC & ROS.  No chief complaint on file.   Alexis Mccormick is a 35 y.o. female that is following up for her left hip and left knee pain.  She does have some popping in the left hip but denies any pain.  She gets stiff from her hip from time to time.  The left knee is getting better.  She is not complaining 100%.  Also presenting with right foot plantar pain.  It is worse with the first few steps.   Review of Systems See HPI   HISTORY: Past Medical, Surgical, Social, and Family History Reviewed & Updated per EMR.   Pertinent Historical Findings include:  Past Medical History:  Diagnosis Date  . Asthma     History reviewed. No pertinent surgical history.  History reviewed. No pertinent family history.  Social History   Socioeconomic History  . Marital status: Unknown    Spouse name: Not on file  . Number of children: Not on file  . Years of education: Not on file  . Highest education level: Not on file  Occupational History  . Not on file  Tobacco Use  . Smoking status: Never Smoker  . Smokeless tobacco: Never Used  Substance and Sexual Activity  . Alcohol use: Yes    Comment: very rarely   . Drug use: No  . Sexual activity: Not on file  Other Topics Concern  . Not on file  Social History Narrative  . Not on file   Social Determinants of Health   Financial Resource Strain: Not on file  Food Insecurity: Not on file  Transportation Needs: Not on file  Physical Activity: Not on file  Stress: Not on file  Social Connections: Not on file  Intimate Partner Violence: Not on file     PHYSICAL EXAM:  VS: BP 118/80 (BP Location: Left Arm, Patient Position: Sitting, Cuff Size: Large)   Ht 5\' 4"  (1.626 m)   Wt 253 lb (114.8 kg)   BMI 43.43 kg/m  Physical Exam Gen: NAD, alert, cooperative with exam, well-appearing    ASSESSMENT & PLAN:   Degenerative tear of medial  meniscus of left knee Slowly continues to get improvement.  Pain is intermittent in nature. -Counseled on home exercise therapy and supportive care. -Could consider MRI or injection.  Snapping hip syndrome, left Has snapping of the hip but with no significant pain.  She notices symptoms from time to time.  Could be concerning for labral tear from her accident. -Counseled on home exercise therapy and supportive care. -Could consider injection or MRI.  Plantar fasciitis of right foot Symptoms most consistent with Planter fasciitis of the right foot. -Counseled on home exercise therapy and supportive care. -Midfoot arch strap. -Could consider injection

## 2020-10-26 NOTE — Assessment & Plan Note (Signed)
Has snapping of the hip but with no significant pain.  She notices symptoms from time to time.  Could be concerning for labral tear from her accident. -Counseled on home exercise therapy and supportive care. -Could consider injection or MRI.

## 2020-10-26 NOTE — Assessment & Plan Note (Signed)
Symptoms most consistent with Planter fasciitis of the right foot. -Counseled on home exercise therapy and supportive care. -Midfoot arch strap. -Could consider injection

## 2020-10-31 ENCOUNTER — Other Ambulatory Visit: Payer: Self-pay

## 2020-10-31 ENCOUNTER — Encounter: Payer: Self-pay | Admitting: Physical Therapy

## 2020-10-31 ENCOUNTER — Ambulatory Visit: Payer: 59 | Admitting: Physical Therapy

## 2020-10-31 DIAGNOSIS — M25562 Pain in left knee: Secondary | ICD-10-CM | POA: Diagnosis not present

## 2020-10-31 DIAGNOSIS — M6281 Muscle weakness (generalized): Secondary | ICD-10-CM

## 2020-10-31 DIAGNOSIS — M545 Low back pain, unspecified: Secondary | ICD-10-CM

## 2020-10-31 DIAGNOSIS — M25552 Pain in left hip: Secondary | ICD-10-CM

## 2020-10-31 NOTE — Therapy (Signed)
[place]. [place], [place], [address] Phone: [phone]   Fax:  [phone]  Physical Therapy Treatment  Patient Details  Name: Alexis Mccormick MRN: [id number] Date of Birth: Oct 21, 1985 Referring Provider (PT): Clinton Quant Date: 10/31/2020   PT End of Session - 10/31/20 1422    Visit Number 9    Date for PT Re-Evaluation 11/12/20    PT Start Time 5176    PT Stop Time 1429    PT Time Calculation (min) 44 min    Activity Tolerance Patient tolerated treatment well    Behavior During Therapy [facility] for tasks assessed/performed           Past Medical History:  Diagnosis Date  . Asthma     History reviewed. No pertinent surgical history.  There were no vitals filed for this visit.   Subjective Assessment - 10/31/20 1351    Subjective No pain just some stiffness in the L hip          0/10 pain                   [facility] Adult PT Treatment/Exercise - 10/31/20 0001      Lumbar Exercises: Seated   Sit to Stand 15 reps   x2 to mat table 20lb     Knee/Hip Exercises: Aerobic   Elliptical L1 x 2 min    Recumbent Bike L2. x5 min      Knee/Hip Exercises: Machines for Strengthening   Cybex Knee Extension 20lb 2x10, LLE 2x10    Cybex Knee Flexion 45# 2x15 BLE, LLE 20lb 2x10      Knee/Hip Exercises: Standing   Heel Raises Both;15 reps;2 sets    Walking with Sports Cord 40lb Side step over .5 foam roll x 10 each, 40lb LLE resisted step 2x5                    PT Short Term Goals - 08/17/20 0839      PT SHORT TERM GOAL #1   Title Independent with initial HEP    Status Achieved             PT Long Term Goals - 10/31/20 1423      PT LONG TERM GOAL #1   Title Independent with advanced HEP    Status Achieved      PT LONG TERM GOAL #2   Title Pt will demo BLE strength at least 4+/5 without pain    Status Partially Met      PT LONG TERM GOAL #3   Title Pt will report  improved standing and walking tolerance throughout work day with </=2/10 back and LLE pain.    Status Achieved      PT LONG TERM GOAL #4   Title Pt will demo minimal quadriceps tightness bilaterally    Status Partially Met                 Plan - 10/31/20 1422    Clinical Impression Statement No issues with today's interventions. Some weakness and hesitation with resisted step ups. Cues to increase step length with side steps. Some fatigue noted with sit to stands. Pt able to tolerated increase resistance with leg extensions and curls.    Personal Factors and Comorbidities Age;Time since onset of injury/illness/exacerbation;Profession    Examination-Activity Limitations Locomotion Level;Stairs    Examination-Participation Restrictions Community Activity;Occupation    Stability/Clinical Decision Making Stable/Uncomplicated  Rehab Potential Good    PT Frequency 1x / week    PT Duration 8 weeks    PT Treatment/Interventions ADLs/Self Care Home Management;Cryotherapy;Electrical Stimulation;Iontophoresis 4mg /ml Dexamethasone;Moist Heat;Gait training;Stair training;Functional mobility training;Therapeutic activities;Therapeutic exercise;Neuromuscular re-education;Manual techniques;Patient/family education;Taping;Vasopneumatic Device    PT Next Visit Plan progress L hip and LE stability and strength, manual and modalities as indicated., Possible D/C           Patient will benefit from skilled therapeutic intervention in order to improve the following deficits and impairments:  Abnormal gait,Difficulty walking,Increased muscle spasms,Pain,Improper body mechanics,Decreased strength,Decreased mobility,Postural dysfunction  Visit Diagnosis: Muscle weakness (generalized)  Acute pain of left knee  Acute low back pain, unspecified back pain laterality, unspecified whether sciatica present  Pain in left hip     Problem List Patient Active Problem List   Diagnosis Date Noted  .  Plantar fasciitis of right foot 10/26/2020  . Pes anserine bursitis 09/21/2020  . Snapping hip syndrome, left 07/24/2020  . Degenerative tear of medial meniscus of left knee 07/24/2020    Scot Jun 10/31/2020, 2:25 PM  [place]. [place], [place], [address] Phone: [phone]   Fax:  [phone]  Name: Alexis Mccormick MRN: [id number] Date of Birth: December 05, 1985

## 2020-11-08 ENCOUNTER — Other Ambulatory Visit: Payer: Self-pay

## 2020-11-08 ENCOUNTER — Encounter: Payer: Self-pay | Admitting: Physical Therapy

## 2020-11-08 ENCOUNTER — Ambulatory Visit: Payer: 59 | Attending: Family Medicine | Admitting: Physical Therapy

## 2020-11-08 DIAGNOSIS — M25552 Pain in left hip: Secondary | ICD-10-CM | POA: Diagnosis present

## 2020-11-08 DIAGNOSIS — M25562 Pain in left knee: Secondary | ICD-10-CM | POA: Insufficient documentation

## 2020-11-08 DIAGNOSIS — M25561 Pain in right knee: Secondary | ICD-10-CM | POA: Diagnosis present

## 2020-11-08 DIAGNOSIS — M6281 Muscle weakness (generalized): Secondary | ICD-10-CM | POA: Insufficient documentation

## 2020-11-08 DIAGNOSIS — M62830 Muscle spasm of back: Secondary | ICD-10-CM | POA: Diagnosis present

## 2020-11-08 DIAGNOSIS — M545 Low back pain, unspecified: Secondary | ICD-10-CM | POA: Insufficient documentation

## 2020-11-08 NOTE — Therapy (Signed)
[place]. [place], [place], [address] Phone: [phone]   Fax:  [phone]  Physical Therapy Treatment  Patient Details  Name: Alexis Mccormick MRN: [id number] Date of Birth: 09/25/1985 Referring Provider (PT): Clinton Quant Date: 11/08/2020   PT End of Session - 11/08/20 0920    Visit Number 10    Date for PT Re-Evaluation 11/12/20    PT Start Time 0845    PT Stop Time 0920    PT Time Calculation (min) 35 min    Activity Tolerance Patient tolerated treatment well    Behavior During Therapy [facility] for tasks assessed/performed           Past Medical History:  Diagnosis Date  . Asthma     History reviewed. No pertinent surgical history.  There were no vitals filed for this visit.   Subjective Assessment - 11/08/20 0845    Subjective Knees are all right hip is a little stiff          0/10 pain    [facility] PT Assessment - 11/08/20 0001      Strength   Strength Assessment Site Knee    Right/Left Knee Left;Right    Right Knee Flexion 4+/5    Right Knee Extension 4+/5    Left Knee Flexion 4+/5    Left Knee Extension 4+/5                         [facility] Adult PT Treatment/Exercise - 11/08/20 0001      Knee/Hip Exercises: Aerobic   Elliptical L2 x 4 min    Nustep L5 x 4 min LE only      Knee/Hip Exercises: Machines for Strengthening   Cybex Knee Extension 20lb 2x12    Cybex Knee Flexion 45# 2x15 BLE      Knee/Hip Exercises: Standing   Heel Raises Both;15 reps;2 sets    Lateral Step Up Both;1 set;10 reps;Hand Hold: 0;Step Height: 8"    Forward Step Up Both;1 set;10 reps;Hand Hold: 0;Step Height: 8"    Walking with Sports Cord 40lb Side step over .5 foam roll x 10 each      Knee/Hip Exercises: Seated   Sit to Sand 10 reps;without UE support;3 sets   OHP ble ball                   PT Short Term Goals - 08/17/20 0839      PT SHORT TERM GOAL #1   Title Independent  with initial HEP    Status Achieved             PT Long Term Goals - 11/08/20 0856      PT LONG TERM GOAL #2   Title Pt will demo BLE strength at least 4+/5 without pain    Status Achieved      PT LONG TERM GOAL #3   Title Pt will report improved standing and walking tolerance throughout work day with </=2/10 back and LLE pain.    Status Achieved      PT LONG TERM GOAL #4   Title Pt will demo minimal quadriceps tightness bilaterally    Status Achieved                 Plan - 11/08/20 0920    Clinical Impression Statement Pt has progressed meeting all goals. All interventions completed well without issues. She is pleased with her current functional  status and reports no limitations    Personal Factors and Comorbidities Age;Time since onset of injury/illness/exacerbation;Profession    Examination-Activity Limitations Locomotion Level;Stairs    Examination-Participation Restrictions Community Activity;Occupation    Stability/Clinical Decision Making Stable/Uncomplicated    Rehab Potential Good    PT Frequency 1x / week    PT Duration 8 weeks    PT Next Visit Plan D/C PT           Patient will benefit from skilled therapeutic intervention in order to improve the following deficits and impairments:  Abnormal gait,Difficulty walking,Increased muscle spasms,Pain,Improper body mechanics,Decreased strength,Decreased mobility,Postural dysfunction  Visit Diagnosis: Acute pain of left knee  Acute low back pain, unspecified back pain laterality, unspecified whether sciatica present  Pain in left hip  Muscle weakness (generalized)  Acute pain of right knee  Muscle spasm of back     Problem List Patient Active Problem List   Diagnosis Date Noted  . Plantar fasciitis of right foot 10/26/2020  . Pes anserine bursitis 09/21/2020  . Snapping hip syndrome, left 07/24/2020  . Degenerative tear of medial meniscus of left knee 07/24/2020   PHYSICAL THERAPY DISCHARGE  SUMMARY  Visits from Start of Care: 10  Plan: Patient agrees to discharge.  Patient goals were met. Patient is being discharged due to meeting the stated rehab goals.  ?????       Scot Jun, PTA 11/08/2020, 9:22 AM  [place]. [place], [place], [address] Phone: [phone]   Fax:  [phone]  Name: Alexis Mccormick MRN: [id number] Date of Birth: [birth year]

## 2021-01-24 LAB — HM PAP SMEAR: HM Pap smear: NEGATIVE

## 2021-03-01 ENCOUNTER — Emergency Department (HOSPITAL_COMMUNITY)
Admission: EM | Admit: 2021-03-01 | Discharge: 2021-03-01 | Disposition: A | Discharge status: Home / Self Care | Payer: 59 | Attending: Emergency Medicine | Admitting: Emergency Medicine

## 2021-03-01 ENCOUNTER — Other Ambulatory Visit: Payer: Self-pay

## 2021-03-01 ENCOUNTER — Encounter (HOSPITAL_COMMUNITY): Payer: Self-pay

## 2021-03-01 DIAGNOSIS — J45909 Unspecified asthma, uncomplicated: Secondary | ICD-10-CM | POA: Diagnosis not present

## 2021-03-01 DIAGNOSIS — R1010 Upper abdominal pain, unspecified: Secondary | ICD-10-CM | POA: Diagnosis not present

## 2021-03-01 DIAGNOSIS — N760 Acute vaginitis: Secondary | ICD-10-CM | POA: Insufficient documentation

## 2021-03-01 DIAGNOSIS — N898 Other specified noninflammatory disorders of vagina: Secondary | ICD-10-CM | POA: Diagnosis present

## 2021-03-01 LAB — CBC WITH DIFFERENTIAL/PLATELET
Abs Immature Granulocytes: 0.01 10*3/uL (ref 0.00–0.07)
Basophils Absolute: 0 10*3/uL (ref 0.0–0.1)
Basophils Relative: 0 %
Eosinophils Absolute: 0.1 10*3/uL (ref 0.0–0.5)
Eosinophils Relative: 1 %
HCT: 38 % (ref 36.0–46.0)
Hemoglobin: 12 g/dL (ref 12.0–15.0)
Immature Granulocytes: 0 %
Lymphocytes Relative: 30 %
Lymphs Abs: 1.8 10*3/uL (ref 0.7–4.0)
MCH: 25.6 pg — ABNORMAL LOW (ref 26.0–34.0)
MCHC: 31.6 g/dL (ref 30.0–36.0)
MCV: 81.2 fL (ref 80.0–100.0)
Monocytes Absolute: 0.5 10*3/uL (ref 0.1–1.0)
Monocytes Relative: 7 %
Neutro Abs: 3.9 10*3/uL (ref 1.7–7.7)
Neutrophils Relative %: 62 %
Platelets: 337 10*3/uL (ref 150–400)
RBC: 4.68 MIL/uL (ref 3.87–5.11)
RDW: 15.7 % — ABNORMAL HIGH (ref 11.5–15.5)
WBC: 6.2 10*3/uL (ref 4.0–10.5)
nRBC: 0 % (ref 0.0–0.2)

## 2021-03-01 LAB — COMPREHENSIVE METABOLIC PANEL
ALT: 13 U/L (ref 0–44)
AST: 15 U/L (ref 15–41)
Albumin: 4.1 g/dL (ref 3.5–5.0)
Alkaline Phosphatase: 54 U/L (ref 38–126)
Anion gap: 9 (ref 5–15)
BUN: 11 mg/dL (ref 6–20)
CO2: 23 mmol/L (ref 22–32)
Calcium: 9.1 mg/dL (ref 8.9–10.3)
Chloride: 106 mmol/L (ref 98–111)
Creatinine, Ser: 0.62 mg/dL (ref 0.44–1.00)
GFR, Estimated: >60 mL/min (ref >60)
Glucose, Bld: 96 mg/dL (ref 70–99)
Potassium: 3.7 mmol/L (ref 3.5–5.1)
Sodium: 138 mmol/L (ref 135–145)
Total Bilirubin: 0.5 mg/dL (ref 0.3–1.2)
Total Protein: 8.3 g/dL — ABNORMAL HIGH (ref 6.5–8.1)

## 2021-03-01 LAB — WET PREP, GENITAL
Clue Cells Wet Prep HPF POC: NONE SEEN
Sperm: NONE SEEN
Trich, Wet Prep: NONE SEEN
Yeast Wet Prep HPF POC: NONE SEEN

## 2021-03-01 LAB — URINALYSIS, ROUTINE W REFLEX MICROSCOPIC
Bilirubin Urine: NEGATIVE
Glucose, UA: NEGATIVE mg/dL
Hgb urine dipstick: NEGATIVE
Ketones, ur: NEGATIVE mg/dL
Leukocytes,Ua: NEGATIVE
Nitrite: NEGATIVE
Protein, ur: NEGATIVE mg/dL
Specific Gravity, Urine: 1.02 (ref 1.005–1.030)
pH: 6 (ref 5.0–8.0)

## 2021-03-01 LAB — LIPASE, BLOOD: Lipase: 28 U/L (ref 11–51)

## 2021-03-01 LAB — I-STAT BETA HCG BLOOD, ED (MC, WL, AP ONLY): I-stat hCG, quantitative: <5 m[IU]/mL (ref <5)

## 2021-03-01 MED ORDER — ONDANSETRON HCL 4 MG PO TABS: 4.0000 mg | ORAL_TABLET | Freq: Four times a day (QID) | ORAL | 0 refills | Status: DC

## 2021-03-01 MED ORDER — CLOBETASOL PROPIONATE 0.05 % EX CREA: 1.0000 "application " | TOPICAL_CREAM | Freq: Two times a day (BID) | CUTANEOUS | 0 refills | Status: DC
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
## 2021-03-01 NOTE — ED Triage Notes (Signed)
Pt reports vaginal irritation and discharge x2 weeks and nausea x 4 days. Obgyn prescribed her diflucan and flagyl when it first started and has had no improvement

## 2021-03-01 NOTE — ED Notes (Signed)
Pt ambulatory in ED lobby. 

## 2021-03-01 NOTE — ED Notes (Signed)
Pt urine specimen/culture obtained, placed in triage collection bin, holding for order.

## 2021-03-29 ENCOUNTER — Other Ambulatory Visit: Payer: Self-pay

## 2021-03-30 ENCOUNTER — Other Ambulatory Visit: Payer: Self-pay

## 2021-03-30 ENCOUNTER — Encounter: Payer: Self-pay | Admitting: Obstetrics & Gynecology

## 2021-03-30 ENCOUNTER — Other Ambulatory Visit (HOSPITAL_COMMUNITY)
Admission: RE | Admit: 2021-03-30 | Discharge: 2021-03-30 | Disposition: A | Discharge status: Home / Self Care | Payer: PRIVATE HEALTH INSURANCE | Source: Ambulatory Visit | Attending: Obstetrics & Gynecology | Admitting: Obstetrics & Gynecology

## 2021-03-30 ENCOUNTER — Ambulatory Visit (INDEPENDENT_AMBULATORY_CARE_PROVIDER_SITE_OTHER): Payer: PRIVATE HEALTH INSURANCE | Admitting: Obstetrics & Gynecology

## 2021-03-30 VITALS — BP 136/90 | HR 78 | Ht 64.0 in | Wt 262.0 lb

## 2021-03-30 DIAGNOSIS — N898 Other specified noninflammatory disorders of vagina: Secondary | ICD-10-CM | POA: Insufficient documentation

## 2021-03-30 NOTE — Progress Notes (Signed)
Patient ID: Alexis Mccormick, female   DOB: Jan 23, 1986, 35 y.o.   MRN: [id number]  Chief Complaint  Patient presents with   NEW GYN    HPI Alexis Mccormick is a 35 y.o. female.  [id number] Patient's last menstrual period was 03/11/2021 (exact date). For the last few weeks patient has been seen and treated for vaginal vulva itch and she has gotten recurrent symptoms after treatment for BV and yeast. Her sx are not present currently. She had a vulvar follicle cyst rupture and drain a few days ago. Gyn had prescribe steroid cream for her sx but she did not use this yet HPI  Past Medical History:  Diagnosis Date   Anemia    Anxiety    Asthma    Depression    Fibroid    Hypertension     History reviewed. No pertinent surgical history.  Family History  Problem Relation Age of Onset   Diabetes Mother    Cancer Maternal Grandmother    Diabetes Maternal Grandfather     Social History Social History   Tobacco Use   Smoking status: Never   Smokeless tobacco: Never  Vaping Use   Vaping Use: Never used  Substance Use Topics   Alcohol use: Yes    Comment: very rarely    Drug use: No    No Known Allergies  Current Outpatient Medications  Medication Sig Dispense Refill   albuterol (PROVENTIL HFA;VENTOLIN HFA) 108 (90 Base) MCG/ACT inhaler Inhale 2 puffs into the lungs every 6 (six) hours as needed for wheezing or shortness of breath. 1 Inhaler 0   amoxicillin (AMOXIL) 500 MG capsule Take 500 mg by mouth 3 (three) times daily.     clobetasol cream (TEMOVATE) 2.63 % Apply 1 application topically 2 (two) times daily. 30 g 0   ibuprofen (ADVIL) 800 MG tablet Take 800 mg by mouth every 6 (six) hours as needed.     meloxicam (MOBIC) 7.5 MG tablet Take 1 tablet (7.5 mg total) by mouth 2 (two) times daily as needed for pain. 45 tablet 1   ondansetron (ZOFRAN) 4 MG tablet Take 1 tablet (4 mg total) by mouth every 6 (six) hours. 12 tablet 0   topiramate (TOPAMAX) 50 MG tablet Take 50 mg by mouth  daily.     Vitamin D, Ergocalciferol, (DRISDOL) 1.25 MG (50000 UNIT) CAPS capsule Take 50,000 Units by mouth once a week.     No current facility-administered medications for this visit.    Review of Systems Review of Systems  Constitutional: Negative.   Respiratory: Negative.    Genitourinary:  Positive for vaginal pain (irritation and itch intermittent). Negative for menstrual problem, pelvic pain and vaginal discharge.  Psychiatric/Behavioral:  The patient is nervous/anxious.    Blood pressure 136/90, pulse 78, height 5\' 4"  (1.626 m), weight 262 lb (118.8 kg), last menstrual period 03/11/2021.  Physical Exam Physical Exam Vitals and nursing note reviewed. Exam conducted with a chaperone present.  Constitutional:      Appearance: Normal appearance.  Cardiovascular:     Rate and Rhythm: Normal rate.  Pulmonary:     Effort: Pulmonary effort is normal.  Genitourinary:    General: Normal vulva.     Vagina: Vaginal discharge: minimal vaginal discharge, no lesions.  Musculoskeletal:        General: Normal range of motion.  Skin:    General: Skin is warm and dry.  Neurological:     General: No focal deficit present.     Mental  Status: She is alert and oriented to person, place, and time.  Psychiatric:        Mood and Affect: Mood normal.        Behavior: Behavior normal.    Data Reviewed Pap 01/2021 negative  Assessment Intermittent vaginal irritation that recurs after treatment Spontaneous drainage of cutaneous vulvar follicle cyst   Plan  F/U on vaginal probe test for vaginitis She should f/u if her symptoms return    Emeterio Reeve 03/30/2021, 9:04 AM

## 2021-03-30 NOTE — Progress Notes (Signed)
NEW GYN presents for discharge, itching, odor, bump on labia.  Denies dysuria. Last PAP 01/24/2021.  Patient stated that is feels like she has lost herself and don't know what is normal anymore.

## 2021-04-03 LAB — CERVICOVAGINAL ANCILLARY ONLY
Bacterial Vaginitis (gardnerella): NEGATIVE
Candida Glabrata: NEGATIVE
Candida Vaginitis: NEGATIVE
Chlamydia: NEGATIVE
Comment: NEGATIVE
Comment: NEGATIVE
Comment: NEGATIVE
Comment: NEGATIVE
Comment: NEGATIVE
Comment: NORMAL
Neisseria Gonorrhea: NEGATIVE
Trichomonas: NEGATIVE

## 2021-04-10 DIAGNOSIS — R002 Palpitations: Secondary | ICD-10-CM

## 2021-04-10 DIAGNOSIS — R42 Dizziness and giddiness: Secondary | ICD-10-CM

## 2021-04-10 HISTORY — DX: Dizziness and giddiness: R42

## 2021-04-10 HISTORY — DX: Palpitations: R00.2

## 2021-04-15 ENCOUNTER — Other Ambulatory Visit: Payer: Self-pay

## 2021-04-15 ENCOUNTER — Emergency Department (HOSPITAL_BASED_OUTPATIENT_CLINIC_OR_DEPARTMENT_OTHER): Payer: 59

## 2021-04-15 ENCOUNTER — Emergency Department (HOSPITAL_BASED_OUTPATIENT_CLINIC_OR_DEPARTMENT_OTHER)
Admission: EM | Admit: 2021-04-15 | Discharge: 2021-04-15 | Disposition: A | Discharge status: Home / Self Care | Payer: 59 | Attending: Emergency Medicine | Admitting: Emergency Medicine

## 2021-04-15 ENCOUNTER — Encounter (HOSPITAL_BASED_OUTPATIENT_CLINIC_OR_DEPARTMENT_OTHER): Payer: Self-pay | Admitting: Emergency Medicine

## 2021-04-15 DIAGNOSIS — J45909 Unspecified asthma, uncomplicated: Secondary | ICD-10-CM | POA: Diagnosis not present

## 2021-04-15 DIAGNOSIS — I10 Essential (primary) hypertension: Secondary | ICD-10-CM | POA: Insufficient documentation

## 2021-04-15 DIAGNOSIS — R1013 Epigastric pain: Secondary | ICD-10-CM | POA: Insufficient documentation

## 2021-04-15 DIAGNOSIS — R0789 Other chest pain: Secondary | ICD-10-CM | POA: Insufficient documentation

## 2021-04-15 LAB — URINALYSIS, ROUTINE W REFLEX MICROSCOPIC
Bilirubin Urine: NEGATIVE
Glucose, UA: NEGATIVE mg/dL
Ketones, ur: NEGATIVE mg/dL
Leukocytes,Ua: NEGATIVE
Nitrite: NEGATIVE
Protein, ur: NEGATIVE mg/dL
Specific Gravity, Urine: 1.01 (ref 1.005–1.030)
pH: 5.5 (ref 5.0–8.0)

## 2021-04-15 LAB — BASIC METABOLIC PANEL
Anion gap: 10 (ref 5–15)
BUN: 10 mg/dL (ref 6–20)
CO2: 22 mmol/L (ref 22–32)
Calcium: 9 mg/dL (ref 8.9–10.3)
Chloride: 102 mmol/L (ref 98–111)
Creatinine, Ser: 0.77 mg/dL (ref 0.44–1.00)
GFR, Estimated: >60 mL/min (ref >60)
Glucose, Bld: 88 mg/dL (ref 70–99)
Potassium: 3.3 mmol/L — ABNORMAL LOW (ref 3.5–5.1)
Sodium: 134 mmol/L — ABNORMAL LOW (ref 135–145)

## 2021-04-15 LAB — URINALYSIS, MICROSCOPIC (REFLEX)

## 2021-04-15 LAB — CBC
HCT: 38.3 % (ref 36.0–46.0)
Hemoglobin: 12.2 g/dL (ref 12.0–15.0)
MCH: 25.6 pg — ABNORMAL LOW (ref 26.0–34.0)
MCHC: 31.9 g/dL (ref 30.0–36.0)
MCV: 80.5 fL (ref 80.0–100.0)
Platelets: 331 10*3/uL (ref 150–400)
RBC: 4.76 MIL/uL (ref 3.87–5.11)
RDW: 15.4 % (ref 11.5–15.5)
WBC: 8.1 10*3/uL (ref 4.0–10.5)
nRBC: 0 % (ref 0.0–0.2)

## 2021-04-15 LAB — TROPONIN I (HIGH SENSITIVITY): Troponin I (High Sensitivity): 2 ng/L (ref <18)

## 2021-04-15 LAB — PREGNANCY, URINE: Preg Test, Ur: NEGATIVE

## 2021-04-15 IMAGING — CR DG CHEST 2V
2 series · 2 of 2 positions shown · non-contrast
Comparison: [DATE]

CLINICAL DATA: Chest pain.

EXAM:
CHEST - 2 VIEW

[w chest pa]
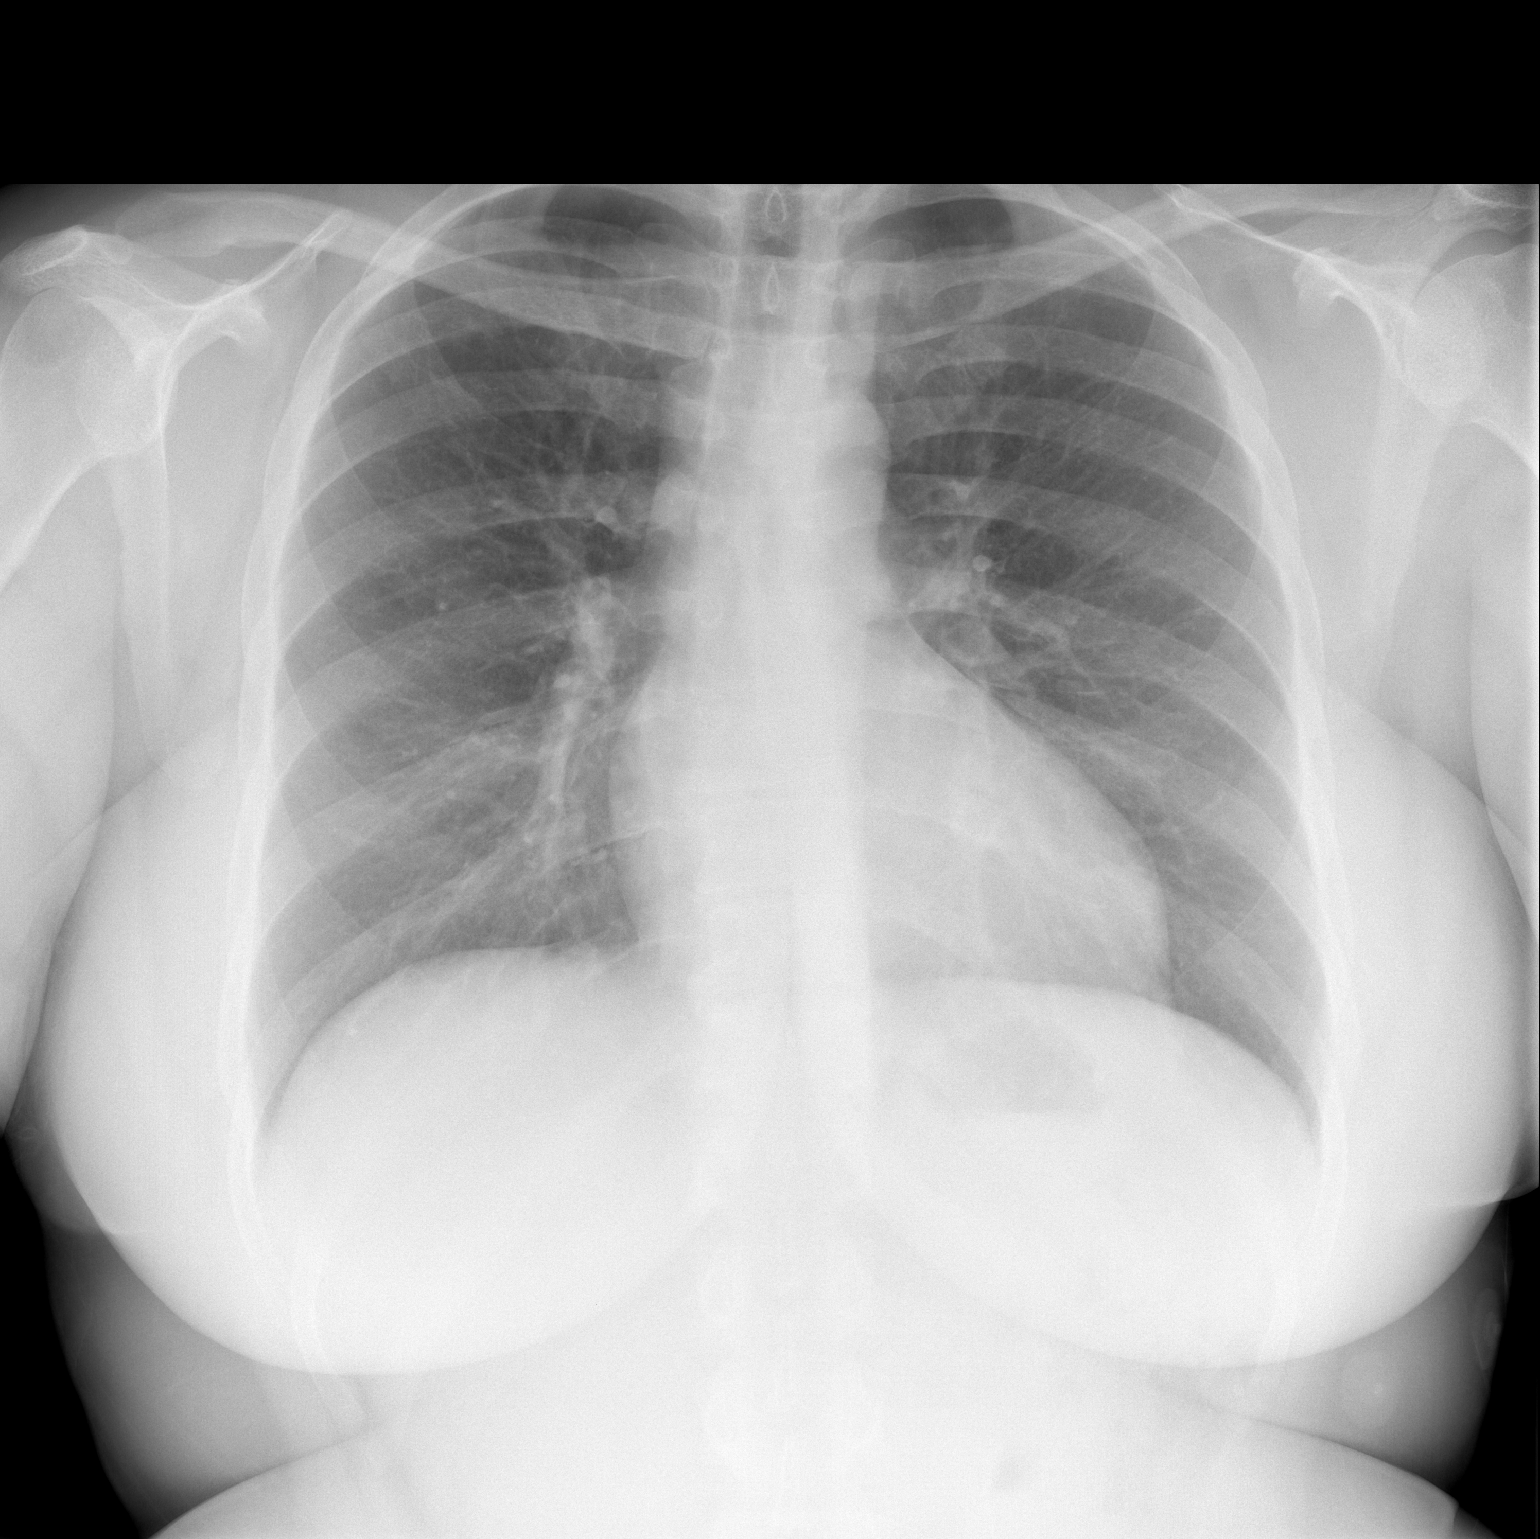

[w chest lat]
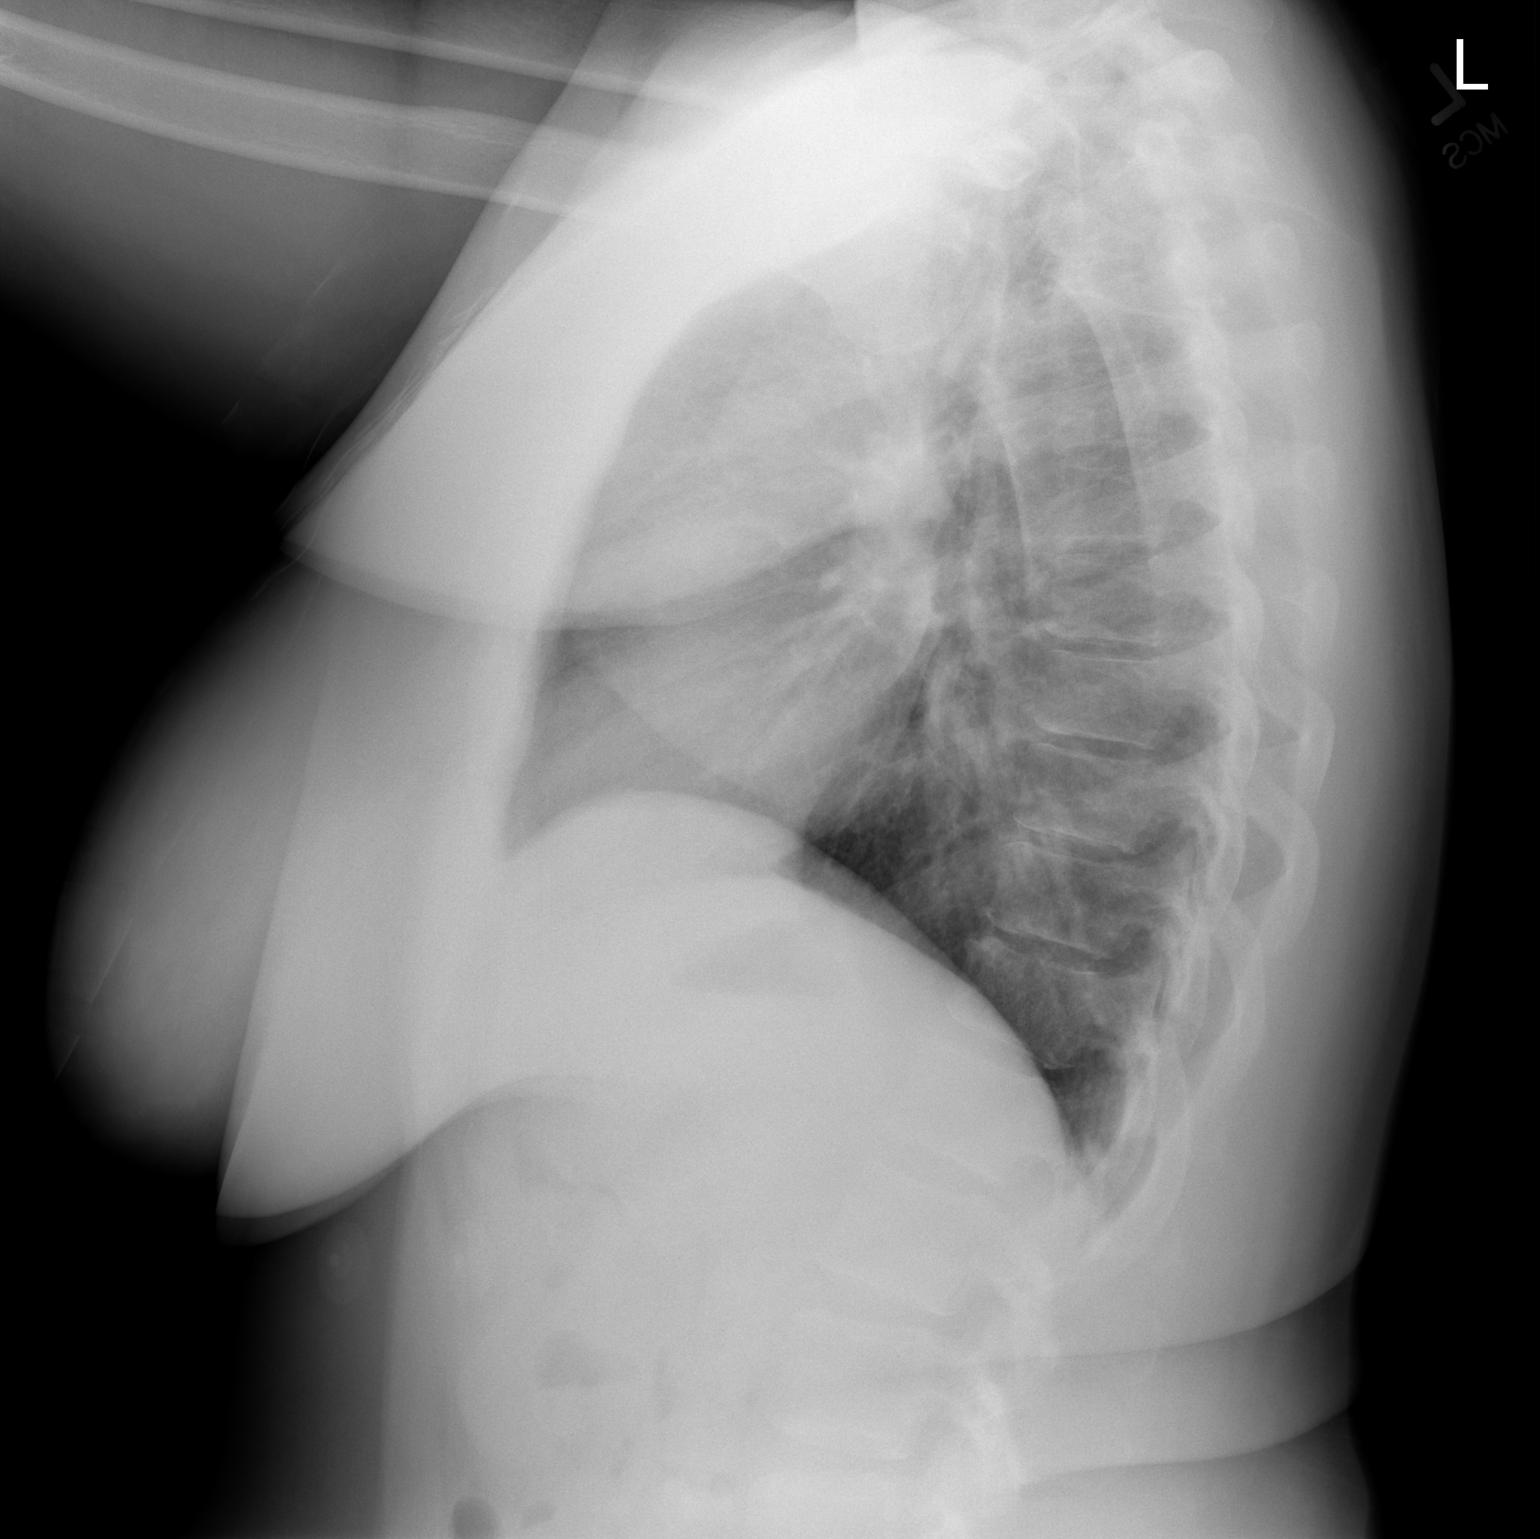

[2 of 2 positions shown; findings below may reference images not displayed]

FINDINGS: The heart size and mediastinal contours are within normal limits.
Both lungs are clear. The visualized skeletal structures are
unremarkable.
IMPRESSION: No active cardiopulmonary disease.

## 2021-04-15 NOTE — ED Triage Notes (Signed)
Pt arrives pov, ambulatory to triage, with non-radiating mid sternal CP x 3 days. Pt denies cough, fever or shob

## 2021-04-15 NOTE — ED Notes (Signed)
Discharge instructions discussed with pt. Pt verbalized understanding. Pt stable and ambulatory.  °

## 2021-04-15 NOTE — ED Provider Notes (Addendum)
[facility] Provider Note   CSN: [id number] Arrival date & time: 04/15/21  1515     History Chief Complaint  Patient presents with   Chest Pain    Alexis Mccormick is a 35 y.o. female.  Patient with 3-day history of tenderness to palpation at the end of her sternum.  Tender to touch.  Seems to hurt more when she is crunches over.  No cough fever or shortness of breath.  Patient is also been followed by her primary care doctor for some epigastric discomfort.  They have already checked her for H. pylori which was negative.  And it sounds as if they are working towards may be a formal peptic ulcer disease work-up may be with an upper endoscopy.  But patient states there is tenderness in this area has been new just for the past 3 days.      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 No pertinent surgical history.   OB History     Gravida  1   Para      Term      Preterm      AB  1   Living  0      SAB      IAB      Ectopic      Multiple      Live Births              Family History  Problem Relation Age of Onset   Diabetes Mother    Cancer Maternal Grandmother    Diabetes Maternal Grandfather     Social History   Tobacco Use   Smoking status: Never   Smokeless tobacco: Never  Vaping Use   Vaping Use: Never used  Substance Use Topics   Alcohol use: Yes    Comment: very rarely    Drug use: No    Home Medications Prior to Admission medications   Medication Sig Start Date End Date Taking? Authorizing Provider  albuterol (PROVENTIL HFA;VENTOLIN HFA) 108 (90 Base) MCG/ACT inhaler Inhale  2 puffs into the lungs every 6 (six) hours as needed for wheezing or shortness of breath. 11/27/17 04/14/21  Kara Dies, NP  amoxicillin (AMOXIL) 500 MG capsule Take 500 mg by mouth 3 (three) times daily. 03/28/21   [provider]  clobetasol cream (TEMOVATE) 7.02 % Apply 1 application topically 2 (two) times daily. 03/01/21   Arnaldo Natal, MD  ibuprofen (ADVIL) 800 MG tablet Take 800 mg by mouth every 6 (six) hours as needed. 03/28/21   [provider]  meloxicam (MOBIC) 7.5 MG tablet Take 1 tablet (7.5 mg total) by mouth 2 (two) times daily as needed for pain. 09/21/20   Rosemarie Ax, MD  ondansetron (ZOFRAN) 4 MG tablet Take 1 tablet (4 mg total) by mouth every 6 (six) hours. 03/01/21   Arnaldo Natal, MD  topiramate (TOPAMAX) 50 MG tablet Take 50 mg by mouth daily. 02/15/21   [provider]  Vitamin D, Ergocalciferol, (DRISDOL) 1.25 MG (50000 UNIT) CAPS capsule Take  50,000 Units by mouth once a week. 02/15/21   [provider]    Allergies    Patient has no known allergies.  Review of Systems   Review of Systems  Constitutional:  Negative for chills and fever.  HENT:  Negative for ear pain and sore throat.   Eyes:  Negative for pain and visual disturbance.  Respiratory:  Negative for cough and shortness of breath.   Cardiovascular:  Positive for chest pain. Negative for palpitations.  Gastrointestinal:  Negative for abdominal pain and vomiting.  Genitourinary:  Negative for dysuria and hematuria.  Musculoskeletal:  Negative for arthralgias and back pain.  Skin:  Negative for color change and rash.  Neurological:  Negative for seizures and syncope.  All other systems reviewed and are negative.  Physical Exam Updated Vital Signs BP (!) 141/84   Pulse (!) 56   Temp 98.4 F (36.9 C) (Oral)   Resp 16   Ht 1.626 m (5\' 4" )   Wt 115.7 kg   LMP 04/04/2021   SpO2 99%   BMI 43.77 kg/m   Physical Exam Vitals and nursing note reviewed.   Constitutional:      General: She is not in acute distress.    Appearance: Normal appearance. She is well-developed.  HENT:     Head: Normocephalic and atraumatic.  Eyes:     Extraocular Movements: Extraocular movements intact.     Conjunctiva/sclera: Conjunctivae normal.     Pupils: Pupils are equal, round, and reactive to light.  Cardiovascular:     Rate and Rhythm: Normal rate and regular rhythm.     Heart sounds: No murmur heard. Pulmonary:     Effort: Pulmonary effort is normal. No respiratory distress.     Breath sounds: Normal breath sounds.     Comments: Tenderness to palpation at the distal sternum.  Reproducible. Chest:     Chest wall: Tenderness present.  Abdominal:     General: There is no distension.     Palpations: Abdomen is soft. There is no mass.     Tenderness: There is no abdominal tenderness. There is no guarding.     Comments: No abdominal tenderness no tenderness in the epigastric area  Musculoskeletal:        General: Normal range of motion.     Cervical back: Normal range of motion and neck supple.  Skin:    General: Skin is warm and dry.  Neurological:     General: No focal deficit present.     Mental Status: She is alert and oriented to person, place, and time.    ED Results / Procedures / Treatments   Labs (all labs ordered are listed, but only abnormal results are displayed) Labs Reviewed  BASIC METABOLIC PANEL - Abnormal; Notable for the following components:      Result Value   Sodium 134 (*)    Potassium 3.3 (*)    All other components within normal limits  CBC - Abnormal; Notable for the following components:   MCH 25.6 (*)    All other components within normal limits  URINALYSIS, ROUTINE W REFLEX MICROSCOPIC - Abnormal; Notable for the following components:   Hgb urine dipstick TRACE (*)    All other components within normal limits  URINALYSIS, MICROSCOPIC (REFLEX) - Abnormal; Notable for the following components:   Bacteria, UA RARE  (*)    All other components within normal limits  PREGNANCY, URINE  TROPONIN I (HIGH SENSITIVITY)  TROPONIN I (HIGH SENSITIVITY)  EKG EKG Interpretation  Date/Time:  Sunday April 15 2021 15:23:20 EST Ventricular Rate:  81 PR Interval:  130 QRS Duration: 94 QT Interval:  330 QTC Calculation: 383 R Axis:   67 Text Interpretation: Normal sinus rhythm Nonspecific T wave abnormality Abnormal ECG Confirmed by Fredia Sorrow [phone]) on 04/15/2021 9:37:09 PM  Radiology DG Chest 2 View  Result Date: 04/15/2021 CLINICAL DATA:  Chest pain. EXAM: CHEST - 2 VIEW COMPARISON:  September 29, 2016 FINDINGS: The heart size and mediastinal contours are within normal limits. Both lungs are clear. The visualized skeletal structures are unremarkable. IMPRESSION: No active cardiopulmonary disease. Electronically Signed   By: Dorise Bullion III M.D.   On: 04/15/2021 16:07    Procedures Procedures   Medications Ordered in ED Medications - No data to display  ED Course  I have reviewed the triage vital signs and the nursing notes.  Pertinent labs & imaging results that were available during my care of the patient were reviewed by me and considered in my medical decision making (see chart for details).    MDM Rules/Calculators/A&P                           Work-up here today shows no evidence of any acute cardiac disease troponin was normal.  1 troponins fine with this going on for 3 days.  EKG without acute changes chest x-ray without any acute cardiopulmonary findings.  Potassium was a little low at 3.3.  No leukocytosis hemoglobin is normal.  Pregnancy test negative urinalysis negative. Final Clinical Impression(s) / ED Diagnoses Final diagnoses:  Chest wall pain    Rx / DC Orders ED Discharge Orders     None        Fredia Sorrow, MD 04/15/21 2157    Fredia Sorrow, MD 04/15/21 2157

## 2021-04-15 NOTE — Discharge Instructions (Addendum)
Work-up here today for the chest pain shows tenderness at the end of the sternum.  This would be chest wall in nature.  Cardiac wise no evidence of any problems.  Chest x-ray normal.  Does sound like your primary care doctor has been working up and epigastric type abdominal pain with some considerations for possible ulcer disease.  Would continue to follow that up.  Your potassium here today was 3.3 with 3.5 being normal so it was just slightly low.  Would recommend foods high in potassium like bananas and spinach.  Have your primary care doctor recheck that potassium.

## 2021-05-24 ENCOUNTER — Ambulatory Visit (INDEPENDENT_AMBULATORY_CARE_PROVIDER_SITE_OTHER): Payer: PRIVATE HEALTH INSURANCE | Admitting: Family Medicine

## 2021-05-24 ENCOUNTER — Encounter: Payer: Self-pay | Admitting: Family Medicine

## 2021-05-24 VITALS — BP 120/80 | Ht 64.0 in | Wt 260.0 lb

## 2021-05-24 DIAGNOSIS — M222X1 Patellofemoral disorders, right knee: Secondary | ICD-10-CM | POA: Insufficient documentation

## 2021-05-24 DIAGNOSIS — M222X2 Patellofemoral disorders, left knee: Secondary | ICD-10-CM | POA: Diagnosis not present

## 2021-05-24 MED ORDER — DICLOFENAC SODIUM 1 % EX GEL: 4.0000 g | Freq: Four times a day (QID) | CUTANEOUS | 11 refills | Status: DC

## 2021-05-24 NOTE — Patient Instructions (Signed)
Good to see you Please use ice  Please limit the repetitive movements  Please try the exercises  Please try physical therapy   Please send me a message in MyChart with any questions or updates.  Please see me back in 4 weeks.   --Dr. Raeford Razor

## 2021-05-24 NOTE — Progress Notes (Signed)
°  Alexis Mccormick - 35 y.o. female MRN [id number]  Date of birth: 1986/04/20  SUBJECTIVE:  Including CC & ROS.  No chief complaint on file.   Alexis Mccormick is a 35 y.o. female that is presenting with bilateral knee pain.  The pain seems to be worse when she gets up from a seated position.  It is anterior in nature.  Denies any injury.  Is not able to take anti-inflammatories at this time.   Review of Systems See HPI   HISTORY: Past Medical, Surgical, Social, and Family History Reviewed & Updated per EMR.   Pertinent Historical Findings include:  Past Medical History:  Diagnosis Date   Anemia    Anxiety    Asthma    Depression    Fibroid    Hypertension     History reviewed. No pertinent surgical history.  Family History  Problem Relation Age of Onset   Diabetes Mother    Cancer Maternal Grandmother    Diabetes Maternal Grandfather     Social History   Socioeconomic History   Marital status: Single    Spouse name: Not on file   Number of children: 0   Years of education: Not on file   Highest education level: Not on file  Occupational History   Not on file  Tobacco Use   Smoking status: Never   Smokeless tobacco: Never  Vaping Use   Vaping Use: Never used  Substance and Sexual Activity   Alcohol use: Yes    Comment: very rarely    Drug use: No   Sexual activity: Yes    Birth control/protection: None  Other Topics Concern   Not on file  Social History Narrative   Not on file   Social Determinants of Health   Financial Resource Strain: Not on file  Food Insecurity: Not on file  Transportation Needs: Not on file  Physical Activity: Not on file  Stress: Not on file  Social Connections: Not on file  Intimate Partner Violence: Not on file     PHYSICAL EXAM:  VS: BP 120/80 (BP Location: Left Arm, Patient Position: Sitting)    Ht 5\' 4"  (1.626 m)    Wt 260 lb (117.9 kg)    BMI 44.63 kg/m  Physical Exam Gen: NAD, alert, cooperative with exam,  well-appearing    ASSESSMENT & PLAN:   Patellofemoral syndrome of both knees Symptoms are more consistent with patellofemoral in nature.  No effusion on exam.  She has been doing repetitive activities with work. -Counseled on home exercise therapy and supportive care. -Voltaren. -Referral to physical therapy. - Could consider injection.

## 2021-05-24 NOTE — Assessment & Plan Note (Signed)
Symptoms are more consistent with patellofemoral in nature.  No effusion on exam.  She has been doing repetitive activities with work. -Counseled on home exercise therapy and supportive care. -Voltaren. -Referral to physical therapy. - Could consider injection.

## 2021-05-30 ENCOUNTER — Emergency Department (HOSPITAL_COMMUNITY)
Admission: EM | Admit: 2021-05-30 | Discharge: 2021-05-30 | Disposition: A | Discharge status: Home / Self Care | Payer: 59 | Attending: Emergency Medicine | Admitting: Emergency Medicine

## 2021-05-30 ENCOUNTER — Emergency Department (HOSPITAL_COMMUNITY): Payer: 59

## 2021-05-30 ENCOUNTER — Encounter (HOSPITAL_COMMUNITY): Payer: Self-pay

## 2021-05-30 ENCOUNTER — Other Ambulatory Visit: Payer: Self-pay

## 2021-05-30 DIAGNOSIS — N9489 Other specified conditions associated with female genital organs and menstrual cycle: Secondary | ICD-10-CM | POA: Diagnosis not present

## 2021-05-30 DIAGNOSIS — R079 Chest pain, unspecified: Secondary | ICD-10-CM

## 2021-05-30 DIAGNOSIS — R002 Palpitations: Secondary | ICD-10-CM | POA: Insufficient documentation

## 2021-05-30 DIAGNOSIS — J45909 Unspecified asthma, uncomplicated: Secondary | ICD-10-CM | POA: Diagnosis not present

## 2021-05-30 DIAGNOSIS — I10 Essential (primary) hypertension: Secondary | ICD-10-CM | POA: Insufficient documentation

## 2021-05-30 DIAGNOSIS — R0789 Other chest pain: Secondary | ICD-10-CM | POA: Insufficient documentation

## 2021-05-30 HISTORY — DX: Chest pain, unspecified: R07.9

## 2021-05-30 LAB — CBC WITH DIFFERENTIAL/PLATELET
Abs Immature Granulocytes: 0.01 10*3/uL (ref 0.00–0.07)
Basophils Absolute: 0 10*3/uL (ref 0.0–0.1)
Basophils Relative: 0 %
Eosinophils Absolute: 0.2 10*3/uL (ref 0.0–0.5)
Eosinophils Relative: 3 %
HCT: 36.2 % (ref 36.0–46.0)
Hemoglobin: 11.3 g/dL — ABNORMAL LOW (ref 12.0–15.0)
Immature Granulocytes: 0 %
Lymphocytes Relative: 31 %
Lymphs Abs: 1.8 10*3/uL (ref 0.7–4.0)
MCH: 25.6 pg — ABNORMAL LOW (ref 26.0–34.0)
MCHC: 31.2 g/dL (ref 30.0–36.0)
MCV: 82.1 fL (ref 80.0–100.0)
Monocytes Absolute: 0.4 10*3/uL (ref 0.1–1.0)
Monocytes Relative: 6 %
Neutro Abs: 3.5 10*3/uL (ref 1.7–7.7)
Neutrophils Relative %: 60 %
Platelets: 327 10*3/uL (ref 150–400)
RBC: 4.41 MIL/uL (ref 3.87–5.11)
RDW: 14.9 % (ref 11.5–15.5)
WBC: 5.9 10*3/uL (ref 4.0–10.5)
nRBC: 0 % (ref 0.0–0.2)

## 2021-05-30 LAB — URINALYSIS, ROUTINE W REFLEX MICROSCOPIC
Bilirubin Urine: NEGATIVE
Glucose, UA: NEGATIVE mg/dL
Hgb urine dipstick: NEGATIVE
Ketones, ur: NEGATIVE mg/dL
Leukocytes,Ua: NEGATIVE
Nitrite: NEGATIVE
Protein, ur: NEGATIVE mg/dL
Specific Gravity, Urine: 1.005 (ref 1.005–1.030)
pH: 7 (ref 5.0–8.0)

## 2021-05-30 LAB — COMPREHENSIVE METABOLIC PANEL
ALT: 13 U/L (ref 0–44)
AST: 11 U/L — ABNORMAL LOW (ref 15–41)
Albumin: 3.9 g/dL (ref 3.5–5.0)
Alkaline Phosphatase: 57 U/L (ref 38–126)
Anion gap: 8 (ref 5–15)
BUN: 14 mg/dL (ref 6–20)
CO2: 23 mmol/L (ref 22–32)
Calcium: 9.1 mg/dL (ref 8.9–10.3)
Chloride: 106 mmol/L (ref 98–111)
Creatinine, Ser: 0.68 mg/dL (ref 0.44–1.00)
GFR, Estimated: >60 mL/min (ref >60)
Glucose, Bld: 100 mg/dL — ABNORMAL HIGH (ref 70–99)
Potassium: 3.7 mmol/L (ref 3.5–5.1)
Sodium: 137 mmol/L (ref 135–145)
Total Bilirubin: 0.3 mg/dL (ref 0.3–1.2)
Total Protein: 7.9 g/dL (ref 6.5–8.1)

## 2021-05-30 LAB — TROPONIN I (HIGH SENSITIVITY)
Troponin I (High Sensitivity): 2 ng/L (ref <18)
Troponin I (High Sensitivity): 2 ng/L (ref <18)

## 2021-05-30 LAB — I-STAT BETA HCG BLOOD, ED (MC, WL, AP ONLY): I-stat hCG, quantitative: <5 m[IU]/mL (ref <5)

## 2021-05-30 LAB — TSH: TSH: 1.349 u[IU]/mL (ref 0.350–4.500)

## 2021-05-30 IMAGING — CR DG CHEST 2V
2 series · 2 of 2 positions shown · non-contrast
Comparison: Chest radiographs [DATE] and earlier.

CLINICAL DATA: 35-year-old female with chest pain and shortness of
breath for several hours.

EXAM:
CHEST - 2 VIEW

[w chest pa]
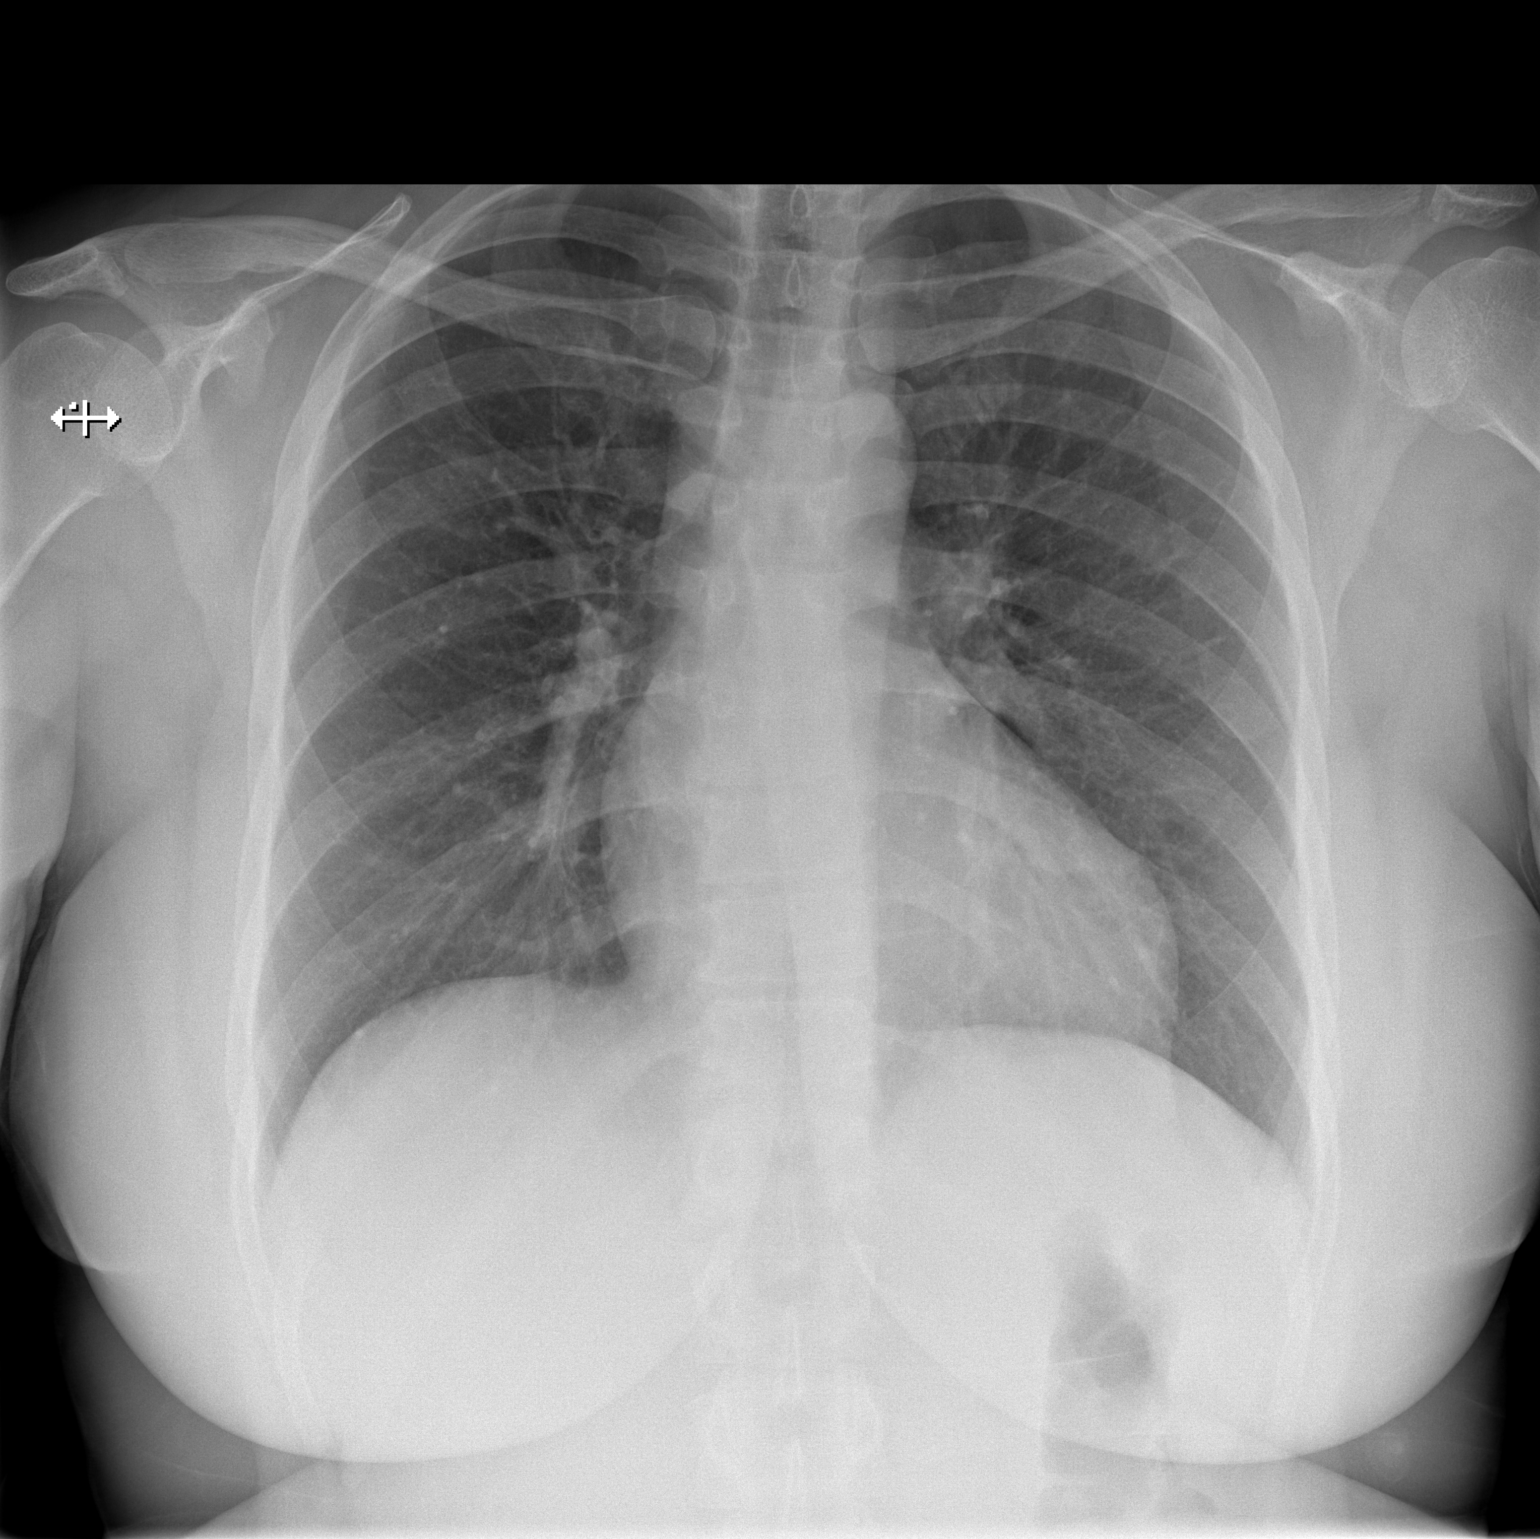

[w chest lat]
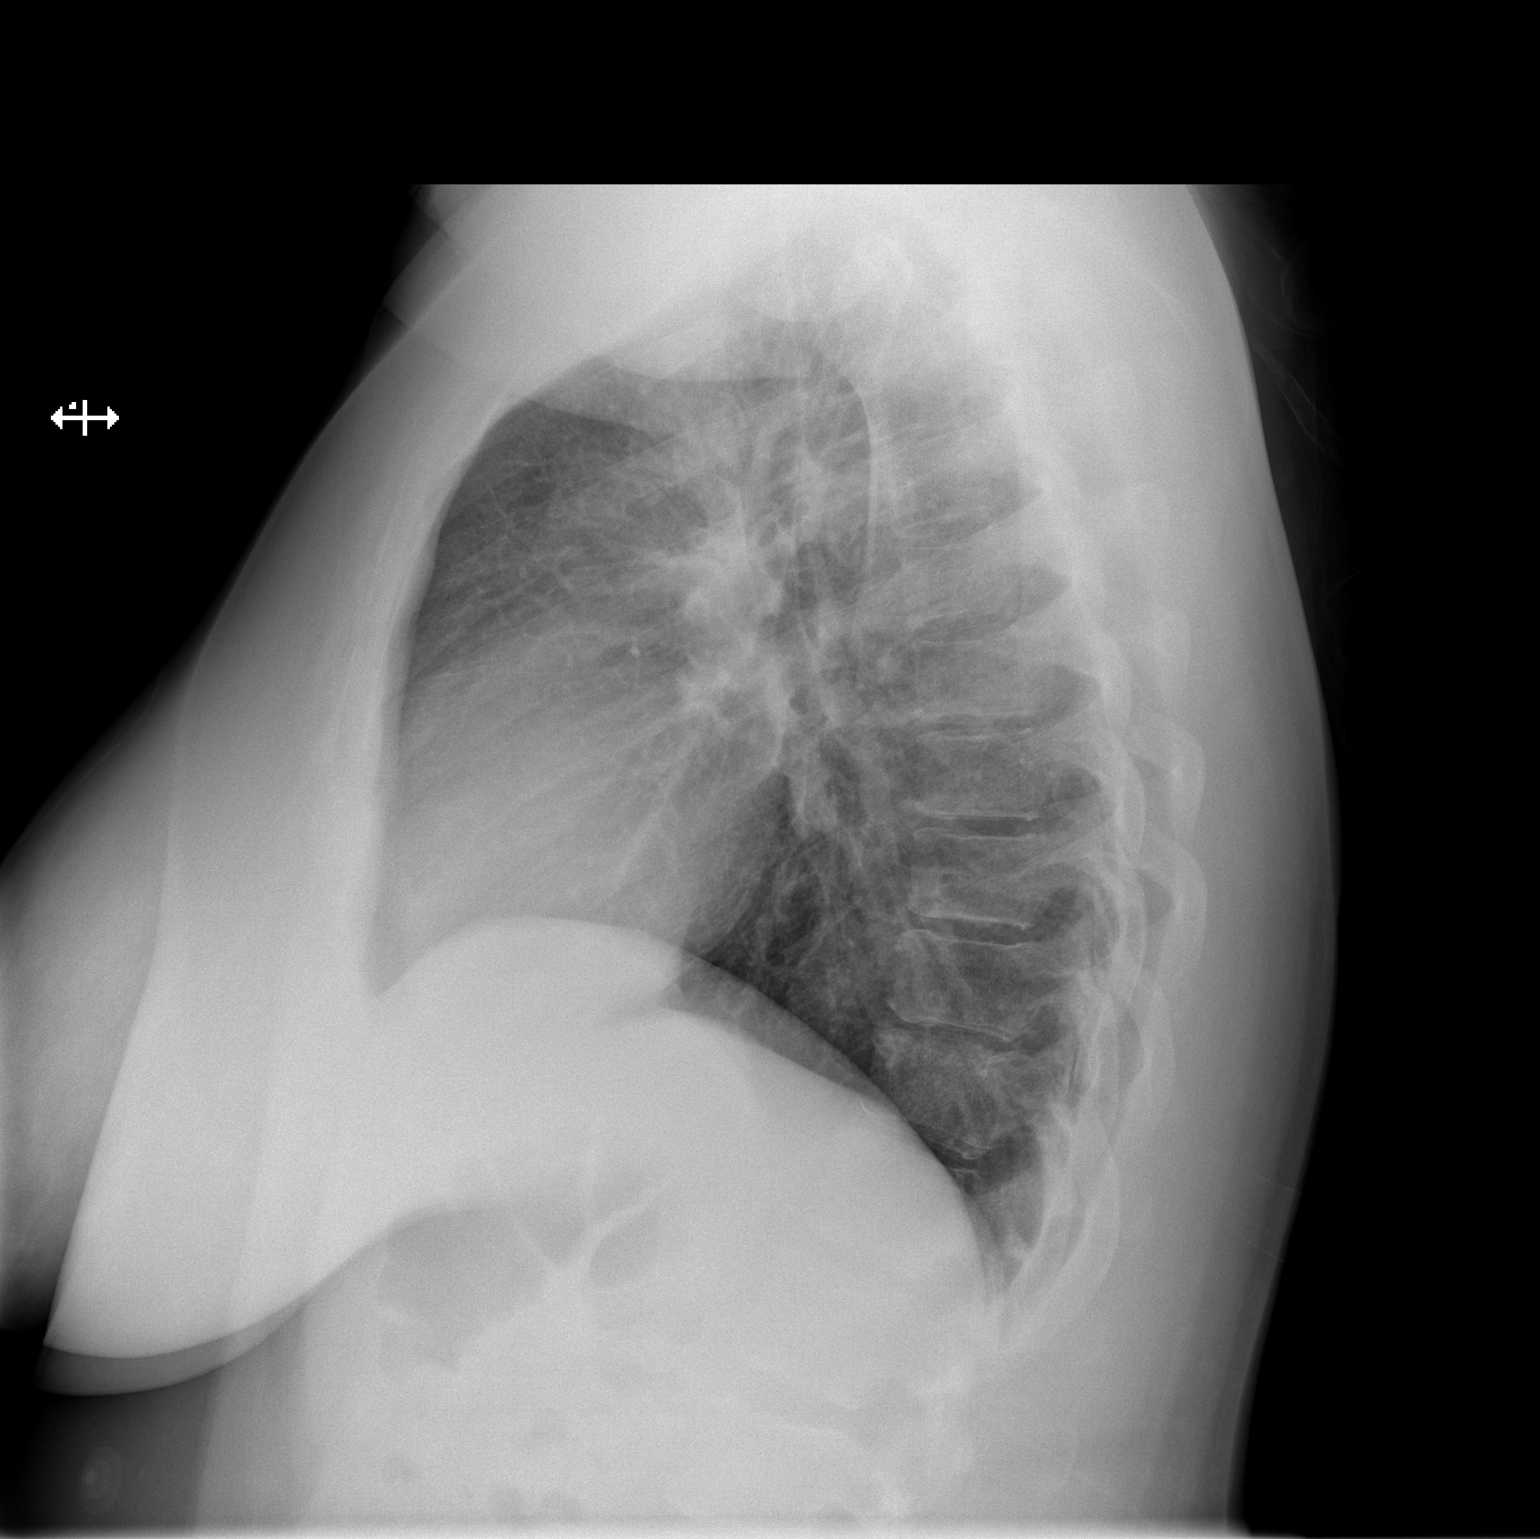

[2 of 2 positions shown; findings below may reference images not displayed]

FINDINGS: Normal lung volumes and mediastinal contours. Visualized tracheal
air column is within normal limits. Both lungs appear clear. No
pneumothorax or pleural effusion.

No osseous abnormality identified.  Negative visible bowel gas.
IMPRESSION: Negative.  No cardiopulmonary abnormality.

## 2021-05-30 NOTE — ED Provider Notes (Signed)
[facility] DEPT Provider Note   CSN: [id number] Arrival date & time: 05/30/21  0426     History Chief Complaint  Patient presents with   Chest Pain   Shortness of Breath    Alexis Mccormick is a 35 y.o. female.  HPI Patient has been aware of her heart beating or racing intermittently for several weeks to months.  She reports she notices it most at night when she is lying down.  Sometimes she changes position because she can feel her heartbeat in her back or her neck.  It also occurs during the day.  She might notice it at work.  She reports sometimes she feels a little sharp chest pain in the left upper chest.  No significant shortness of breath.  She reports sometimes she feels some exertional shortness of breath but attributes that to being somewhat overweight.  No fever no cough.  No lower extremity swelling or calf pain.  No history of PE or DVT.  Patient does have a watch that monitors heart rate.  She reports when she checks it it never shows elevated heart rate.  No problems with nausea vomiting or diarrhea.  Patient has had a recent upper endoscopy and is diagnosed with a small hiatal hernia.  Patient does not smoke, use alcohol or drugs.  Family history negative for early onset cardiac disease or heart problems.    Past Medical History:  Diagnosis Date   Anemia    Anxiety    Asthma    Depression    Fibroid    Hypertension     Patient Active Problem List   Diagnosis Date Noted   Patellofemoral syndrome of both knees 05/24/2021   Plantar fasciitis of right foot 10/26/2020   Pes anserine bursitis 09/21/2020   Snapping hip syndrome, left 07/24/2020   Degenerative tear of medial meniscus of left knee 07/24/2020    History reviewed. No pertinent surgical history.   OB History     Gravida  1   Para      Term      Preterm      AB  1   Living  0      SAB      IAB      Ectopic      Multiple      Live Births               Family History  Problem Relation Age of Onset   Diabetes Mother    Cancer Maternal Grandmother    Diabetes Maternal Grandfather     Social History   Tobacco Use   Smoking status: Never   Smokeless tobacco: Never  Vaping Use   Vaping Use: Never used  Substance Use Topics   Alcohol use: Yes    Comment: very rarely    Drug use: No    Home Medications Prior to Admission medications   Medication Sig Start Date End Date Taking? Authorizing Provider  albuterol (PROVENTIL HFA;VENTOLIN HFA) 108 (90 Base) MCG/ACT inhaler Inhale 2 puffs into the lungs every 6 (six) hours as needed for wheezing or shortness of breath. 11/27/17 04/14/21  Kara Dies, NP  amoxicillin (AMOXIL) 500 MG capsule Take 500 mg by mouth 3 (three) times daily. 03/28/21   [provider]  clobetasol cream (TEMOVATE) 4.26 % Apply 1 application topically 2 (two) times daily. 03/01/21   Arnaldo Natal, MD  diclofenac Sodium (VOLTAREN) 1 % GEL Apply 4 g topically 4 (  four) times daily. To affected joint. 05/24/21   Rosemarie Ax, MD  ibuprofen (ADVIL) 800 MG tablet Take 800 mg by mouth every 6 (six) hours as needed. 03/28/21   [provider]  meloxicam (MOBIC) 7.5 MG tablet Take 1 tablet (7.5 mg total) by mouth 2 (two) times daily as needed for pain. 09/21/20   Rosemarie Ax, MD  ondansetron (ZOFRAN) 4 MG tablet Take 1 tablet (4 mg total) by mouth every 6 (six) hours. 03/01/21   Arnaldo Natal, MD  topiramate (TOPAMAX) 50 MG tablet Take 50 mg by mouth daily. 02/15/21   [provider]  Vitamin D, Ergocalciferol, (DRISDOL) 1.25 MG (50000 UNIT) CAPS capsule Take 50,000 Units by mouth once a week. 02/15/21   [provider]    Allergies    Patient has no known allergies.  Review of Systems   Review of Systems 10 systems reviewed negative except as per HPI Physical Exam Updated Vital Signs BP (!) 164/92    Pulse 68    Temp 98.2 F (36.8 C) (Oral)    Resp 18    Ht 5\' 4"   (1.626 m)    Wt 117.9 kg    LMP 05/23/2021    SpO2 100%    BMI 44.63 kg/m   Physical Exam Constitutional:      Appearance: Normal appearance.  HENT:     Mouth/Throat:     Mouth: Mucous membranes are moist.     Pharynx: Oropharynx is clear.     Comments: Dentition very good condition.  Mucous membranes pink moist.  Posterior oropharynx widely patent. Eyes:     Extraocular Movements: Extraocular movements intact.     Pupils: Pupils are equal, round, and reactive to light.  Neck:     Comments: No lymphadenopathy or thyromegaly Cardiovascular:     Rate and Rhythm: Normal rate and regular rhythm.     Pulses: Normal pulses.  Pulmonary:     Effort: Pulmonary effort is normal.     Breath sounds: Normal breath sounds.  Abdominal:     General: There is no distension.     Palpations: Abdomen is soft.     Tenderness: There is no abdominal tenderness. There is no guarding.  Musculoskeletal:        General: No swelling or tenderness. Normal range of motion.     Cervical back: Neck supple.     Right lower leg: No edema.     Left lower leg: No edema.  Skin:    General: Skin is warm and dry.  Neurological:     General: No focal deficit present.     Mental Status: She is alert and oriented to person, place, and time.     Motor: No weakness.     Coordination: Coordination normal.  Psychiatric:        Mood and Affect: Mood normal.    ED Results / Procedures / Treatments   Labs (all labs ordered are listed, but only abnormal results are displayed) Labs Reviewed  CBC WITH DIFFERENTIAL/PLATELET - Abnormal; Notable for the following components:      Result Value   Hemoglobin 11.3 (*)    MCH 25.6 (*)    All other components within normal limits  COMPREHENSIVE METABOLIC PANEL - Abnormal; Notable for the following components:   Glucose, Bld 100 (*)    AST 11 (*)    All other components within normal limits  URINALYSIS, ROUTINE W REFLEX MICROSCOPIC - Abnormal; Notable for the following  components:   Color, Urine COLORLESS (*)    All other components within normal limits  TSH  I-STAT BETA HCG BLOOD, ED ([facility], [facility], [facility] ONLY)  TROPONIN I (HIGH SENSITIVITY)  TROPONIN I (HIGH SENSITIVITY)    EKG EKG Interpretation  Date/Time:  Wednesday May 30 2021 04:44:05 EST Ventricular Rate:  70 PR Interval:  145 QRS Duration: 91 QT Interval:  393 QTC Calculation: 424 R Axis:   56 Text Interpretation: Sinus rhythm Nonspecific ST abnormality RESOLVED SINCE PREVIOUS Confirmed by Addison Lank [phone]) on 05/30/2021 5:22:44 AM  Radiology DG Chest 2 View  Result Date: 05/30/2021 CLINICAL DATA:  35 year old female with chest pain and shortness of breath for several hours. EXAM: CHEST - 2 VIEW COMPARISON:  Chest radiographs 04/15/2021 and earlier. FINDINGS: Normal lung volumes and mediastinal contours. Visualized tracheal air column is within normal limits. Both lungs appear clear. No pneumothorax or pleural effusion. No osseous abnormality identified.  Negative visible bowel gas. IMPRESSION: Negative.  No cardiopulmonary abnormality. Electronically Signed   By: Genevie Ann M.D.   On: 05/30/2021 06:29    Procedures Procedures   Medications Ordered in ED Medications - No data to display  ED Course  I have reviewed the triage vital signs and the nursing notes.  Pertinent labs & imaging results that were available during my care of the patient were reviewed by me and considered in my medical decision making (see chart for details).    MDM Rules/Calculators/A&P                         Patient is clinically well in appearance.  She is experiencing palpitations intermittently.  Review of systems otherwise does not suggest infectious etiology, ACS or PE.  At this time, patient has normal rhythm and rate in the 60s and 70s.  We will proceed with TSH and troponins chest x-ray already reviewed and within normal limits.  Diagnostic work-up unremarkable at this time.  Patient has had a rate  controlled sinus rhythm throughout her stay in the emergency department.  Patient does not have associated symptoms that would suggest ACS or significant dysrhythmia.  At this time stable for discharge.  We you to follow-up plan.  Patient should follow-up with PCP and discuss continuous monitoring.  Careful return precautions reviewed.   Final Clinical Impression(s) / ED Diagnoses Final diagnoses:  Chest discomfort  Palpitations    Rx / DC Orders ED Discharge Orders     None        Charlesetta Shanks, MD 05/30/21 1109

## 2021-05-30 NOTE — ED Triage Notes (Signed)
Pt reports with chest pain and SHOB x a few hrs. Pt states that she recently had an endoscopy procedure.

## 2021-05-30 NOTE — Discharge Instructions (Signed)
Schedule follow-up appointment with your primary care physician.  Discussed getting a continuous heart monitor that you can wear for a day or more to identify your heart rate while you are having symptoms.  At this time your work-up in the emergency department does not indicate a heart attack or heart rhythm irregularity.  Sometimes episodes can come and go.  Follow instructions for palpitations.  Eliminate caffeine and any type of stimulants if you are taking any over-the-counter medications.  Return to the emergency department if you get worsening chest pain, shortness of breath, passing out or other concerning symptoms.

## 2021-05-30 NOTE — ED Notes (Signed)
Pt states understanding of dc instructions, importance of follow up.  Pt denies questions or concerns upon dc. Pt declined wheelchair assistance upon dc. Pt ambulated out of ed w/ steady gait. No belongings left in room upon dc.  

## 2021-06-07 ENCOUNTER — Encounter: Payer: Self-pay | Admitting: Physical Therapy

## 2021-06-07 ENCOUNTER — Ambulatory Visit: Payer: PRIVATE HEALTH INSURANCE | Attending: Family Medicine | Admitting: Physical Therapy

## 2021-06-07 ENCOUNTER — Other Ambulatory Visit: Payer: Self-pay

## 2021-06-07 DIAGNOSIS — M25561 Pain in right knee: Secondary | ICD-10-CM | POA: Insufficient documentation

## 2021-06-07 DIAGNOSIS — M222X2 Patellofemoral disorders, left knee: Secondary | ICD-10-CM | POA: Insufficient documentation

## 2021-06-07 DIAGNOSIS — G8929 Other chronic pain: Secondary | ICD-10-CM | POA: Insufficient documentation

## 2021-06-07 DIAGNOSIS — M25562 Pain in left knee: Secondary | ICD-10-CM | POA: Diagnosis present

## 2021-06-07 DIAGNOSIS — M222X1 Patellofemoral disorders, right knee: Secondary | ICD-10-CM | POA: Insufficient documentation

## 2021-06-07 DIAGNOSIS — M62838 Other muscle spasm: Secondary | ICD-10-CM | POA: Insufficient documentation

## 2021-06-07 DIAGNOSIS — M25652 Stiffness of left hip, not elsewhere classified: Secondary | ICD-10-CM | POA: Diagnosis present

## 2021-06-07 DIAGNOSIS — M25552 Pain in left hip: Secondary | ICD-10-CM | POA: Insufficient documentation
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
## 2021-06-19 ENCOUNTER — Other Ambulatory Visit: Payer: Self-pay

## 2021-06-19 ENCOUNTER — Ambulatory Visit: Payer: 59 | Attending: Family Medicine

## 2021-06-19 DIAGNOSIS — M25562 Pain in left knee: Secondary | ICD-10-CM | POA: Diagnosis present

## 2021-06-19 DIAGNOSIS — M25552 Pain in left hip: Secondary | ICD-10-CM | POA: Insufficient documentation

## 2021-06-19 DIAGNOSIS — M25652 Stiffness of left hip, not elsewhere classified: Secondary | ICD-10-CM | POA: Insufficient documentation

## 2021-06-19 DIAGNOSIS — G8929 Other chronic pain: Secondary | ICD-10-CM | POA: Insufficient documentation

## 2021-06-19 DIAGNOSIS — M62838 Other muscle spasm: Secondary | ICD-10-CM | POA: Insufficient documentation

## 2021-06-19 DIAGNOSIS — M25561 Pain in right knee: Secondary | ICD-10-CM | POA: Insufficient documentation

## 2021-06-19 NOTE — Therapy (Signed)
[facility] [address]  [place], [place], [address] Phone: [phone]   Fax:  [phone]  Physical Therapy Treatment  Patient Details  Name: Alexis Mccormick MRN: [id number] Date of Birth: 1985/08/12 Referring Provider (PT): Clearance Coots   Encounter Date: 06/19/2021   PT End of Session - 06/19/21 1751     Visit Number 2    Number of Visits 12    Date for PT Re-Evaluation 07/19/21    Authorization Type [employer] VL: 38    PT Start Time 1703    PT Stop Time 1745    PT Time Calculation (min) 42 min    Activity Tolerance Patient tolerated treatment well    Behavior During Therapy [facility] for tasks assessed/performed             Past Medical History:  Diagnosis Date   Anemia    Anxiety    Asthma    Depression    Fibroid    Hypertension     History reviewed. No pertinent surgical history.  There were no vitals filed for this visit.   Subjective Assessment - 06/19/21 1705     Subjective Pt reports that her hip is bothering her today, most of the pain is when she goes on a ladder at work or squats.    Diagnostic tests knee xrays 07/18/20 were negative    Patient Stated Goals be more active, get rid of pain.    Currently in Pain? Yes    Pain Score 6     Pain Location Hip    Pain Orientation Left    Pain Descriptors / Indicators Aching;Tightness    Pain Type Acute pain                               [facility] Adult PT Treatment/Exercise - 06/19/21 0001       Exercises   Exercises Knee/Hip      Knee/Hip Exercises: Stretches   Active Hamstring Stretch Right;Left;2 reps;30 seconds    Active Hamstring Stretch Limitations seated    Quad Stretch Right;2 reps;30 seconds    Quad Stretch Limitations mod thomas with strap    Piriformis Stretch Both;2 reps;30 seconds    Piriformis Stretch Limitations seated      Knee/Hip Exercises: Aerobic   Recumbent Bike L2x8min      Knee/Hip  Exercises: Seated   Long Arc Quad AROM;Both;20 reps    [employer] 10x5"    Other Seated Knee/Hip Exercises monster steps with red TB 10x    Hamstring Curl Strengthening;Both;10 reps    Hamstring Limitations red TB    Sit to Sand 10 reps;without UE support                     PT Education - 06/19/21 1752     Education Details HEP update: Access Code: [email or web address]    Person(s) Educated Patient    Methods Explanation;Demonstration    Comprehension Verbalized understanding;Returned demonstration              PT Short Term Goals - 06/19/21 1753       PT SHORT TERM GOAL #1   Title Independent with initial HEP    Time 2    Period Weeks    Status On-going    Target Date 06/21/21               PT Long  Term Goals - 06/19/21 1753       PT LONG TERM GOAL #1   Title Independent with advanced HEP    Time 6    Period Weeks    Status On-going    Target Date 07/19/21      PT LONG TERM GOAL #2   Title Pt will demonstrate pain free L hip ROM = R hip ROM    Baseline see flowsheet, L hip tightness and pain.    Time 6    Period Weeks    Status On-going    Target Date 07/19/21      PT LONG TERM GOAL #3   Title Pt. will be able to perform gym based exercises without increased R knee pain or popping.    Time 6    Period Weeks    Status On-going    Target Date 07/19/21      PT LONG TERM GOAL #4   Title Pt. will report no more than 2/10 L hip or R knee pain at rest.    Baseline constant L hip pain, increased R knee pain with activity    Time 6    Period Weeks    Status On-going    Target Date 07/19/21                   Plan - 06/19/21 1751     Clinical Impression Statement Pt was able to complete all exercises/interventions today. Noted more L sided tightness with seated piriformis stretch. Provided instruction on the importance of hip mobility to decrease pain and initiated gentle strengthening. No complaints with any of the exercises today. HEP  provided with stretches post session.    Personal Factors and Comorbidities Comorbidity 2    Comorbidities reporting heart palpitations and SOB, asthma, chronic L hip pain    PT Frequency 2x / week    PT Duration 6 weeks    PT Treatment/Interventions ADLs/Self Care Home Management;Cryotherapy;Electrical Stimulation;Iontophoresis 4mg /ml Dexamethasone;Moist Heat;Ultrasound;Gait training;Stair training;Functional mobility training;Therapeutic activities;Therapeutic exercise;Balance training;Neuromuscular re-education;Patient/family education;Manual techniques;Passive range of motion;Dry needling;Taping;Joint Manipulations;Spinal Manipulations    PT Next Visit Plan review HEP; gentle hip/knee strengthening; manual therapy and modalities PRN for R knee    PT Home Exercise Plan Access Code: [email or web address]    Consulted and Agree with Plan of Care Patient             Patient will benefit from skilled therapeutic intervention in order to improve the following deficits and impairments:  Decreased mobility, Difficulty walking, Increased muscle spasms, Decreased range of motion, Increased edema, Decreased activity tolerance, Increased fascial restricitons, Impaired flexibility, Pain  Visit Diagnosis: Acute pain of right knee  Pain in left hip  Stiffness of left hip, not elsewhere classified  Chronic pain of left knee  Other muscle spasm     Problem List Patient Active Problem List   Diagnosis Date Noted   Patellofemoral syndrome of both knees 05/24/2021   Plantar fasciitis of right foot 10/26/2020   Pes anserine bursitis 09/21/2020   Snapping hip syndrome, left 07/24/2020   Degenerative tear of medial meniscus of left knee 07/24/2020    Artist Pais, PTA 06/19/2021, 5:54 PM  [facility] [address]  [place], [place], [address] Phone: [phone]   Fax:  [phone]  Name: Jashawna Reever MRN: [id number] Date of Birth:  1985/08/27

## 2021-06-20 ENCOUNTER — Ambulatory Visit: Payer: 59

## 2021-06-20 DIAGNOSIS — M25561 Pain in right knee: Secondary | ICD-10-CM | POA: Diagnosis not present

## 2021-06-20 DIAGNOSIS — M25652 Stiffness of left hip, not elsewhere classified: Secondary | ICD-10-CM

## 2021-06-20 DIAGNOSIS — G8929 Other chronic pain: Secondary | ICD-10-CM

## 2021-06-20 DIAGNOSIS — M62838 Other muscle spasm: Secondary | ICD-10-CM

## 2021-06-20 DIAGNOSIS — M25552 Pain in left hip: Secondary | ICD-10-CM

## 2021-06-20 NOTE — Therapy (Signed)
[facility] [address]  [place], [place], [address] Phone: [phone]   Fax:  [phone]  Physical Therapy Treatment  Patient Details  Name: Alexis Mccormick MRN: [id number] Date of Birth: Nov 16, 1985 Referring Provider (PT): Clearance Coots   Encounter Date: 06/20/2021   PT End of Session - 06/20/21 0931     Visit Number 3    Number of Visits 12    Date for PT Re-Evaluation 07/19/21    Authorization Type [employer] VL: 59    PT Start Time 0847    PT Stop Time 0928    PT Time Calculation (min) 41 min    Activity Tolerance Patient tolerated treatment well    Behavior During Therapy [facility] for tasks assessed/performed             Past Medical History:  Diagnosis Date   Anemia    Anxiety    Asthma    Depression    Fibroid    Hypertension     History reviewed. No pertinent surgical history.  There were no vitals filed for this visit.   Subjective Assessment - 06/20/21 0849     Subjective Pt reports that the session went fine yesterday but having some continued stiffness.    Diagnostic tests knee xrays 07/18/20 were negative    Patient Stated Goals be more active, get rid of pain.    Currently in Pain? Yes    Pain Score 6     Pain Location Hip    Pain Orientation Left    Pain Descriptors / Indicators Tightness    Pain Type Acute pain                               [facility] Adult PT Treatment/Exercise - 06/20/21 0001       Exercises   Exercises Knee/Hip      Knee/Hip Exercises: Stretches   Active Hamstring Stretch Right;Left;2 reps;30 seconds    Active Hamstring Stretch Limitations seated    Quad Stretch Right;2 reps;30 seconds    Quad Stretch Limitations mod thomas with strap    Piriformis Stretch Both;2 reps;30 seconds    Piriformis Stretch Limitations seated      Knee/Hip Exercises: Aerobic   Recumbent Bike L2x63min      Knee/Hip Exercises: Machines for Strengthening    Cybex Leg Press R/L 10 reps, 15lb      Knee/Hip Exercises: Standing   Hip Abduction Stengthening;Both;10 reps;Knee straight    Abduction Limitations red TB, 2 hand assist      Knee/Hip Exercises: Seated   Other Seated Knee/Hip Exercises monster steps with red TB 15x      Knee/Hip Exercises: Supine   Short Arc Quad Sets AROM;Both;20 reps    Bridges Strengthening;Both;10 reps    Straight Leg Raises Strengthening;Both;10 reps                     PT Education - 06/20/21 [phone]     Education Details HEP update    Person(s) Educated Patient    Methods Explanation;Demonstration    Comprehension Verbalized understanding;Returned demonstration              PT Short Term Goals - 06/20/21 0945       PT SHORT TERM GOAL #1   Title Independent with initial HEP    Time 2    Period Weeks    Status Achieved  06/20/21   Target Date 06/21/21               PT Long Term Goals - 06/19/21 1753       PT LONG TERM GOAL #1   Title Independent with advanced HEP    Time 6    Period Weeks    Status On-going    Target Date 07/19/21      PT LONG TERM GOAL #2   Title Pt will demonstrate pain free L hip ROM = R hip ROM    Baseline see flowsheet, L hip tightness and pain.    Time 6    Period Weeks    Status On-going    Target Date 07/19/21      PT LONG TERM GOAL #3   Title Pt. will be able to perform gym based exercises without increased R knee pain or popping.    Time 6    Period Weeks    Status On-going    Target Date 07/19/21      PT LONG TERM GOAL #4   Title Pt. will report no more than 2/10 L hip or R knee pain at rest.    Baseline constant L hip pain, increased R knee pain with activity    Time 6    Period Weeks    Status On-going    Target Date 07/19/21                   Plan - 06/20/21 0933     Clinical Impression Statement Pt was w/o any complaints during the session. We were able to progress proximal LE strengthening just fine. Increased  repetitions with the monster steps, added standing hip strengthening, and tried leg press machine today. Also updated HEP with proximal LE strengthening exercises. Pt responded well with no increase in pain.    Personal Factors and Comorbidities Comorbidity 2    Comorbidities reporting heart palpitations and SOB, asthma, chronic L hip pain    PT Frequency 2x / week    PT Duration 6 weeks    PT Treatment/Interventions ADLs/Self Care Home Management;Cryotherapy;Electrical Stimulation;Iontophoresis 4mg /ml Dexamethasone;Moist Heat;Ultrasound;Gait training;Stair training;Functional mobility training;Therapeutic activities;Therapeutic exercise;Balance training;Neuromuscular re-education;Patient/family education;Manual techniques;Passive range of motion;Dry needling;Taping;Joint Manipulations;Spinal Manipulations    PT Next Visit Plan review HEP; gentle hip/knee strengthening; manual therapy and modalities PRN for R knee    PT Home Exercise Plan Access Code: [email or web address]    Consulted and Agree with Plan of Care Patient             Patient will benefit from skilled therapeutic intervention in order to improve the following deficits and impairments:  Decreased mobility, Difficulty walking, Increased muscle spasms, Decreased range of motion, Increased edema, Decreased activity tolerance, Increased fascial restricitons, Impaired flexibility, Pain  Visit Diagnosis: Acute pain of right knee  Pain in left hip  Stiffness of left hip, not elsewhere classified  Chronic pain of left knee  Other muscle spasm     Problem List Patient Active Problem List   Diagnosis Date Noted   Patellofemoral syndrome of both knees 05/24/2021   Plantar fasciitis of right foot 10/26/2020   Pes anserine bursitis 09/21/2020   Snapping hip syndrome, left 07/24/2020   Degenerative tear of medial meniscus of left knee 07/24/2020    Artist Pais, PTA 06/20/2021, 9:47 AM  [facility] [address]  [place], [place], [address] Phone: [phone]   Fax:  [phone]  Name:  Alexis Mccormick MRN: [id number] Date of Birth: 1985-09-24

## 2021-06-21 ENCOUNTER — Ambulatory Visit: Payer: 59 | Admitting: Physical Therapy

## 2021-06-27 ENCOUNTER — Encounter: Payer: PRIVATE HEALTH INSURANCE | Admitting: Physical Therapy

## 2021-06-28 ENCOUNTER — Ambulatory Visit (INDEPENDENT_AMBULATORY_CARE_PROVIDER_SITE_OTHER): Payer: PRIVATE HEALTH INSURANCE | Admitting: Family Medicine

## 2021-06-28 ENCOUNTER — Encounter: Payer: Self-pay | Admitting: Family Medicine

## 2021-06-28 VITALS — BP 124/72 | Ht 64.0 in | Wt 254.0 lb

## 2021-06-28 DIAGNOSIS — M222X2 Patellofemoral disorders, left knee: Secondary | ICD-10-CM

## 2021-06-28 DIAGNOSIS — M222X1 Patellofemoral disorders, right knee: Secondary | ICD-10-CM

## 2021-06-28 DIAGNOSIS — H8111 Benign paroxysmal vertigo, right ear: Secondary | ICD-10-CM | POA: Insufficient documentation

## 2021-06-28 DIAGNOSIS — H66001 Acute suppurative otitis media without spontaneous rupture of ear drum, right ear: Secondary | ICD-10-CM | POA: Insufficient documentation

## 2021-06-28 DIAGNOSIS — G5711 Meralgia paresthetica, right lower limb: Secondary | ICD-10-CM

## 2021-06-28 DIAGNOSIS — M7918 Myalgia, other site: Secondary | ICD-10-CM | POA: Diagnosis not present

## 2021-06-28 NOTE — Assessment & Plan Note (Signed)
Pain is intermittent in nature.  Has been getting improvement with physical therapy. -Counseled on home exercise therapy and supportive care. -Continue physical therapy. -Could consider injection versus imaging.

## 2021-06-28 NOTE — Assessment & Plan Note (Signed)
Having pain in the upper back that is periscapular in nature.  Acute on chronic in nature. -Counseled on home exercise therapy for care.

## 2021-06-28 NOTE — Patient Instructions (Signed)
Good to see you Please continue physical therapy as you tolerate  Please try to see ENT   Please send me a message in MyChart with any questions or updates.  Please see me back in 4-6 weeks.   --Dr. Raeford Razor

## 2021-06-28 NOTE — Assessment & Plan Note (Signed)
No external changes observed today on exam.  Does have lymphadenopathy on the right side.  No uvula deviation.  Does have accompanied vertigo. -Counseled on supportive care. -Referral to ENT.

## 2021-06-28 NOTE — Progress Notes (Signed)
°  Alexis Mccormick - 36 y.o. female MRN [id number]  Date of birth: 11-14-1985  SUBJECTIVE:  Including CC & ROS.  No chief complaint on file.   Alexis Mccormick is a 36 y.o. female that is following up for her left knee pain and presenting with acute right ear infection and upper back pain as well as altered sensation in the right.  She has been started on antibiotics.  She denies any trouble swallowing or shortness of breath.  She does have balance issues and sensations of vertigo.    Review of Systems See HPI   HISTORY: Past Medical, Surgical, Social, and Family History Reviewed & Updated per EMR.   Pertinent Historical Findings include:  Past Medical History:  Diagnosis Date   Anemia    Anxiety    Asthma    Depression    Fibroid    Hypertension     History reviewed. No pertinent surgical history.   PHYSICAL EXAM:  VS: BP 124/72 (BP Location: Left Arm, Patient Position: Sitting)    Ht 5\' 4"  (1.626 m)    Wt 254 lb (115.2 kg)    BMI 43.60 kg/m  Physical Exam Gen: NAD, alert, cooperative with exam, well-appearing MSK:  Neurovascularly intact       ASSESSMENT & PLAN:   I spent 45 minutes with this patient, greater than 50% was face-to-face time counseling regarding the below diagnosis.   Non-recurrent acute suppurative otitis media of right ear without spontaneous rupture of tympanic membrane No external changes observed today on exam.  Does have lymphadenopathy on the right side.  No uvula deviation.  Does have accompanied vertigo. -Counseled on supportive care. -Referral to ENT.  Lateral femoral cutaneous entrapment syndrome, right Having sensations in the right thigh that could be related to the lateral femoral cutaneous nerve. -Counseled on supportive care. -Counseled on Voltaren. -Could consider injection.  Patellofemoral syndrome of both knees Pain is intermittent in nature.  Has been getting improvement with physical therapy. -Counseled on home exercise therapy  and supportive care. -Continue physical therapy. -Could consider injection versus imaging.  Myofascial pain syndrome of thoracic spine Having pain in the upper back that is periscapular in nature.  Acute on chronic in nature. -Counseled on home exercise therapy for care.

## 2021-06-28 NOTE — Assessment & Plan Note (Signed)
Having sensations in the right thigh that could be related to the lateral femoral cutaneous nerve. -Counseled on supportive care. -Counseled on Voltaren. -Could consider injection.

## 2021-07-02 ENCOUNTER — Ambulatory Visit: Payer: 59

## 2021-07-02 ENCOUNTER — Other Ambulatory Visit: Payer: Self-pay

## 2021-07-02 DIAGNOSIS — M25561 Pain in right knee: Secondary | ICD-10-CM | POA: Diagnosis not present

## 2021-07-02 DIAGNOSIS — M25562 Pain in left knee: Secondary | ICD-10-CM

## 2021-07-02 DIAGNOSIS — M25652 Stiffness of left hip, not elsewhere classified: Secondary | ICD-10-CM

## 2021-07-02 DIAGNOSIS — M62838 Other muscle spasm: Secondary | ICD-10-CM

## 2021-07-02 DIAGNOSIS — M25552 Pain in left hip: Secondary | ICD-10-CM

## 2021-07-02 DIAGNOSIS — G8929 Other chronic pain: Secondary | ICD-10-CM

## 2021-07-02 NOTE — Therapy (Signed)
[facility] [address]  [place], [place], [address] Phone: [phone]   Fax:  [phone]  Physical Therapy Treatment  Patient Details  Name: Alexis Mccormick MRN: [id number] Date of Birth: Dec 22, 1985 Referring Provider (PT): Clearance Coots   Encounter Date: 07/02/2021   PT End of Session - 07/02/21 1759     Visit Number 4    Number of Visits 12    Date for PT Re-Evaluation 07/19/21    Authorization Type [employer] [facility] VL: 6    PT Start Time 1659    PT Stop Time 3267    PT Time Calculation (min) 46 min    Activity Tolerance Patient tolerated treatment well    Behavior During Therapy [facility] for tasks assessed/performed             Past Medical History:  Diagnosis Date   Anemia    Anxiety    Asthma    Depression    Fibroid    Hypertension     History reviewed. No pertinent surgical history.  There were no vitals filed for this visit.   Subjective Assessment - 07/02/21 1700     Subjective Been having ear pain for about 2 weeks, my knee is doing ok but my ear still hurts.    Diagnostic tests knee xrays 07/18/20 were negative    Patient Stated Goals be more active, get rid of pain.    Currently in Pain? No/denies                               [facility] Adult PT Treatment/Exercise - 07/02/21 0001       Exercises   Exercises Knee/Hip      Knee/Hip Exercises: Stretches   Hip Flexor Stretch Right;Left;30 seconds    Hip Flexor Stretch Limitations seated lunge position    Piriformis Stretch Left;2 reps;30 seconds    Piriformis Stretch Limitations seated figure 4      Knee/Hip Exercises: Aerobic   Recumbent Bike L3x64mn      Knee/Hip Exercises: Supine   Bridges with Ball Squeeze Strengthening;Both;20 reps    Bridges with Clamshell Strengthening;Both;10 reps   red TB   Straight Leg Raise with External Rotation Strengthening;Both;10 reps    Other Supine Knee/Hip Exercises clamshell  15x3" with red TB      Manual Therapy   Manual Therapy Soft tissue mobilization;Myofascial release    Soft tissue mobilization STM to L glute med/min    Myofascial Release TPR to L glute med/min                       PT Short Term Goals - 06/20/21 0945       PT SHORT TERM GOAL #1   Title Independent with initial HEP    Time 2    Period Weeks    Status Achieved   06/20/21   Target Date 06/21/21               PT Long Term Goals - 07/02/21 1758       PT LONG TERM GOAL #1   Title Independent with advanced HEP    Time 6    Period Weeks    Status On-going    Target Date 07/19/21      PT LONG TERM GOAL #2   Title Pt will demonstrate pain free L hip ROM = R hip ROM  Baseline see flowsheet, L hip tightness and pain.    Time 6    Period Weeks    Status On-going    Target Date 07/19/21      PT LONG TERM GOAL #3   Title Pt. will be able to perform gym based exercises without increased R knee pain or popping.    Time 6    Period Weeks    Status On-going    Target Date 07/19/21      PT LONG TERM GOAL #4   Title Pt. will report no more than 2/10 L hip or R knee pain at rest.    Baseline constant L hip pain, increased R knee pain with activity    Time 6    Period Weeks    Status Partially Met   no pain at rest but still pain with activity   Target Date 07/19/21                   Plan - 07/02/21 1800     Clinical Impression Statement Pt has been having ear pains for which she has been treated for and feeling sick so she has not been doing her usual work activities. Pt reports that she also experienced some dizziness within the past 2 weeks. She did well with the exercises today, some cues needed with hip flexor stretch for upright posture and fatigue noted with SLRs. Pt still complains of L glute med tightness, improvements made after manual but she is interested in trying DN.    Personal Factors and Comorbidities Comorbidity 2    Comorbidities  reporting heart palpitations and SOB, asthma, chronic L hip pain    PT Frequency 2x / week    PT Duration 6 weeks    PT Treatment/Interventions ADLs/Self Care Home Management;Cryotherapy;Electrical Stimulation;Iontophoresis 20m/ml Dexamethasone;Moist Heat;Ultrasound;Gait training;Stair training;Functional mobility training;Therapeutic activities;Therapeutic exercise;Balance training;Neuromuscular re-education;Patient/family education;Manual techniques;Passive range of motion;Dry needling;Taping;Joint Manipulations;Spinal Manipulations    PT Next Visit Plan review HEP; gentle hip/knee strengthening; manual therapy and modalities PRN for R knee    PT Home Exercise Plan Access Code: [email or web address]   Consulted and Agree with Plan of Care Patient             Patient will benefit from skilled therapeutic intervention in order to improve the following deficits and impairments:  Decreased mobility, Difficulty walking, Increased muscle spasms, Decreased range of motion, Increased edema, Decreased activity tolerance, Increased fascial restricitons, Impaired flexibility, Pain  Visit Diagnosis: Acute pain of right knee  Pain in left hip  Stiffness of left hip, not elsewhere classified  Chronic pain of left knee  Other muscle spasm     Problem List Patient Active Problem List   Diagnosis Date Noted   Non-recurrent acute suppurative otitis media of right ear without spontaneous rupture of tympanic membrane 06/28/2021   Benign paroxysmal positional vertigo of right ear 06/28/2021   Myofascial pain syndrome of thoracic spine 06/28/2021   Lateral femoral cutaneous entrapment syndrome, right 06/28/2021   Patellofemoral syndrome of both knees 05/24/2021   Plantar fasciitis of right foot 10/26/2020   Pes anserine bursitis 09/21/2020   Snapping hip syndrome, left 07/24/2020   Degenerative tear of medial meniscus of left knee 07/24/2020    BArtist Pais PTA 07/02/2021, 6:09 PM  [facility] [place] [address]Phone: [phone]  Fax:  [phone] Name: Alexis ThebeauMRN: [id number] of Birth: [birth year]

## 2021-07-04 ENCOUNTER — Encounter: Payer: Self-pay | Admitting: Physical Therapy

## 2021-07-04 ENCOUNTER — Other Ambulatory Visit: Payer: Self-pay

## 2021-07-04 ENCOUNTER — Ambulatory Visit: Payer: 59 | Admitting: Physical Therapy

## 2021-07-04 DIAGNOSIS — M25561 Pain in right knee: Secondary | ICD-10-CM

## 2021-07-04 DIAGNOSIS — M62838 Other muscle spasm: Secondary | ICD-10-CM

## 2021-07-04 DIAGNOSIS — M25652 Stiffness of left hip, not elsewhere classified: Secondary | ICD-10-CM

## 2021-07-04 DIAGNOSIS — G8929 Other chronic pain: Secondary | ICD-10-CM

## 2021-07-04 DIAGNOSIS — M25552 Pain in left hip: Secondary | ICD-10-CM
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
## 2021-07-04 NOTE — Patient Instructions (Signed)
Access Code: [email or web address] URL: [email or web address] Date: 07/04/2021 Prepared by: Glenetta Hew  Exercises Forward T - 1 x daily - 7 x weekly - 3 sets - 10 reps Bird Dog on Counter - 1 x daily - 7 x weekly - 3 sets - 10 reps  Trigger Point Dry Needling  What is Trigger Point Dry Needling (DN)? DN is a physical therapy technique used to treat muscle pain and dysfunction. Specifically, DN helps deactivate muscle trigger points (muscle knots).  A thin filiform needle is used to penetrate the skin and stimulate the underlying trigger point. The goal is for a local twitch response (LTR) to occur and for the trigger point to relax. No medication of any kind is injected during the procedure.   What Does Trigger Point Dry Needling Feel Like?  The procedure feels different for each individual patient. Some patients report that they do not actually feel the needle enter the skin and overall the process is not painful. Very mild bleeding may occur. However, many patients feel a deep cramping in the muscle in which the needle was inserted. This is the local twitch response.   How Will I feel after the treatment? Soreness is normal, and the onset of soreness may not occur for a few hours. Typically this soreness does not last longer than two days.  Bruising is uncommon, however; ice can be used to decrease any possible bruising.  In rare cases feeling tired or nauseous after the treatment is normal. In addition, your symptoms may get worse before they get better, this period will typically not last longer than 24 hours.   What Can I do After My Treatment? Increase your hydration by drinking more water for the next 24 hours. You may place ice or heat on the areas treated that have become sore, however, do not use heat on inflamed or bruised areas. Heat often brings more relief post needling. You can continue your regular activities, but vigorous activity is not recommended initially after  the treatment for 24 hours. DN is best combined with other physical therapy such as strengthening, stretching, and other therapies.

## 2021-07-05 DIAGNOSIS — R0600 Dyspnea, unspecified: Secondary | ICD-10-CM

## 2021-07-05 HISTORY — DX: Dyspnea, unspecified: R06.00

## 2021-07-10 ENCOUNTER — Encounter (HOSPITAL_BASED_OUTPATIENT_CLINIC_OR_DEPARTMENT_OTHER): Payer: Self-pay | Admitting: Obstetrics and Gynecology

## 2021-07-10 ENCOUNTER — Other Ambulatory Visit: Payer: Self-pay

## 2021-07-10 NOTE — Progress Notes (Addendum)
Spoke w/ via phone for pre-op interview---pt Lab needs dos---- type & screen, urine pregnancy              Lab results------06/25/21 CBC & BMP in Epic, 05/30/21 Chest xray in Epic, 05/30/21 EKG in Epic & chart COVID test -----patient states asymptomatic no test needed Arrive at -------0530 on Tuesday, 07/17/21 NPO after MN NO Solid Food.  Clear liquids from MN until---0430 Med rec completed Medications to take morning of surgery -----Albuterol inhaler prn, Xanax prn Diabetic medication -----n/a Patient instructed no nail polish to be worn day of surgery Patient instructed to bring photo id and insurance card day of surgery Patient aware to have Driver (ride ) / caregiver    for 24 hours after surgery - boyfriend [facility] Patient Special Instructions -----Bring inhaler. Pre-Op special Istructions -----none Patient verbalized understanding of instructions that were given at this phone interview. Patient denies shortness of breath, chest pain, fever, cough at this phone interview.   Since November of 2022 patient has been seen in the ER for dizziness, heart palpitations, chest pain and sob. (04/15/21, 05/30/21 & 07/05/21) Patient has also been having anxiety attacks. Per 07/05/21 MD note from Dr. Sofie Hartigan in [place], chest pain appears to be musculoskeletal. Patient had a normal cxray & EKG on 05/30/21. Patient stated that she had a normal echocardiogram at [facility] (requested echo results on 07/10/21.) Patient also stated that she wore a heart monitor for several days, but does not have the results yet (requested results 07/10/21 . She has an initial visit with cardiologist Dr. Tonia Ghent at [facility] on 07/19/21 per pt.  Requested results again on 07/11/21 for 07/05/21 EKG and 06/25/21 echocardiogram and heart monitor results, and LOV note from Dr. Brigitte Pulse Cardiologist and PCP Lynwood Dawley, PA. I spoke with Ramond Marrow at [facility] results via fax.

## 2021-07-11 ENCOUNTER — Encounter: Payer: Self-pay | Admitting: Physical Therapy

## 2021-07-11 ENCOUNTER — Ambulatory Visit: Payer: 59 | Attending: Family Medicine | Admitting: Physical Therapy

## 2021-07-11 DIAGNOSIS — M25562 Pain in left knee: Secondary | ICD-10-CM

## 2021-07-11 DIAGNOSIS — G8929 Other chronic pain: Secondary | ICD-10-CM

## 2021-07-11 DIAGNOSIS — M62838 Other muscle spasm: Secondary | ICD-10-CM

## 2021-07-11 DIAGNOSIS — M25652 Stiffness of left hip, not elsewhere classified: Secondary | ICD-10-CM | POA: Diagnosis present

## 2021-07-11 DIAGNOSIS — M25552 Pain in left hip: Secondary | ICD-10-CM | POA: Diagnosis present

## 2021-07-11 DIAGNOSIS — M545 Low back pain, unspecified: Secondary | ICD-10-CM | POA: Diagnosis present

## 2021-07-11 DIAGNOSIS — M25561 Pain in right knee: Secondary | ICD-10-CM

## 2021-07-11 NOTE — Therapy (Signed)
[facility] [address]  [place], [place], [address] Phone: [phone]   Fax:  [phone]  Physical Therapy Treatment  Patient Details  Name: Alexis Mccormick MRN: [id number] Date of Birth: [birth year] Referring Provider (PT): Alexis Mccormick   Encounter Date: 07/11/2021   PT End of Session - 07/11/21 0759     Visit Number 6    Number of Visits 12    Date for PT Re-Evaluation 07/19/21    Authorization Type [employer] VL: 69    PT Start Time 0800    PT Stop Time 0844    PT Time Calculation (min) 44 min    Activity Tolerance Patient tolerated treatment well    Behavior During Therapy [facility] for tasks assessed/performed             Past Medical History:  Diagnosis Date   Anemia    Anxiety    Asthma    Carpal tunnel syndrome    right wrist   Chest pain 05/30/2021   ED visit for heart palpitations, chest pain, sob. 05/30/21 chest xray & EKG normal. Troponin levels normal.See ED OV in Epic from 05/30/21.   Chest pain 07/05/2021   Per 07/05/21 ED note from Dr. Laural Mccormick at [facility] in [place] chest pain appeared to be musculoskeletal.   COVID 2020   very mild symptoms   Depression    Dizziness 04/2021   Dyspnea 07/05/2021   ED visit for CP & SOB.   Fibroid    Heart palpitations 04/2021   Hypertension    Patient has never been put on BP meds. Cardiology appt 07/19/21 with Dr. Tonia Mccormick per pt.   Patellofemoral syndrome of both knees    Plantar fasciitis of right foot    Pre-diabetes    per pt   Wears contact lenses    Wears glasses     Past Surgical History:  Procedure Laterality Date   ESOPHAGOGASTRODUODENOSCOPY  7672   GRAFT APPLICATION  0947   dental bone graft on lower right side   WISDOM TOOTH EXTRACTION  2022    There were no vitals filed for this visit.   Subjective Assessment - 07/11/21 0801     Subjective Pt reporting just some stiffness  in her L hip    Patient Stated Goals be more active, get rid of pain.    Currently in Pain? No/denies                               [facility] Adult PT Treatment/Exercise - 07/11/21 0001       Knee/Hip Exercises: Stretches   Hip Flexor Stretch Right;Left;30 seconds;2 reps    Hip Flexor Stretch Limitations seated lunge position    Piriformis Stretch Left;2 reps;30 seconds    Piriformis Stretch Limitations seated figure 4      Knee/Hip Exercises: Aerobic   Recumbent Bike L3x41min      Knee/Hip Exercises: Standing   Heel Raises Both;2 sets;10 reps;5 seconds    Hip Abduction Both;10 reps;Knee straight    Hip Extension Both;10 reps;Knee straight    Functional Squat 2 sets;10 reps    Functional Squat Limitations from chair; 2nd set with 8# wt; some clicking on right side    Other Standing Knee Exercises single leg RDLs x 10 bil - counter for safety      Manual Therapy   Manual  Therapy Soft tissue mobilization;Myofascial release    Manual therapy comments to decrease L hip and right knee pain and improve mobility    Soft tissue mobilization to lateral quads bil    Myofascial Release to Bil ITB    Other Manual Therapy skilled palpation and monitoring with dry needling              Trigger Point Dry Needling - 07/11/21 0001     Consent Given? Yes    Education Handout Provided Previously provided    Muscles Treated Lower Quadrant Vastus lateralis;Rectus femoris    Dry Needling Comments Bil    Rectus femoris Response Twitch response elicited;Palpable increased muscle length    Vastus lateralis Response Twitch response elicited;Palpable increased muscle length                     PT Short Term Goals - 06/20/21 0945       PT SHORT TERM GOAL #1   Title Independent with initial HEP    Time 2    Period Weeks    Status Achieved   06/20/21   Target Date 06/21/21               PT Long Term Goals - 07/02/21 1758       PT LONG TERM GOAL #1    Title Independent with advanced HEP    Time 6    Period Weeks    Status On-going    Target Date 07/19/21      PT LONG TERM GOAL #2   Title Pt will demonstrate pain free L hip ROM = R hip ROM    Baseline see flowsheet, L hip tightness and pain.    Time 6    Period Weeks    Status On-going    Target Date 07/19/21      PT LONG TERM GOAL #3   Title Pt. will be able to perform gym based exercises without increased R knee pain or popping.    Time 6    Period Weeks    Status On-going    Target Date 07/19/21      PT LONG TERM GOAL #4   Title Pt. will report no more than 2/10 L hip or R knee pain at rest.    Baseline constant L hip pain, increased R knee pain with activity    Time 6    Period Weeks    Status Partially Met   no pain at rest but still pain with activity   Target Date 07/19/21                   Plan - 07/11/21 1810     Clinical Impression Statement Alexis Mccormick reporting gradual improvements overall, but still stiff in her left hip. Right lateral knee pain still present. She responded well to DN at last visit and was open to more to B lateral quads. She had significant responses bil with DN. She demos quad weakness with SL RDL on the R and is still experiencing popping and knee pain with functional squat, but it was tolerable. She will benefit from continued LE strengthening to address hip and knee pain.    PT Frequency 2x / week    PT Duration 6 weeks    PT Treatment/Interventions ADLs/Self Care Home Management;Cryotherapy;Electrical Stimulation;Iontophoresis 4mg /ml Dexamethasone;Moist Heat;Ultrasound;Gait training;Stair training;Functional mobility training;Therapeutic activities;Therapeutic exercise;Balance training;Neuromuscular re-education;Patient/family education;Manual techniques;Passive range of motion;Dry needling;Taping;Joint Manipulations;Spinal Manipulations    PT Next Visit Plan review  HEP; gentle hip/knee strengthening; manual therapy and modalities PRN for R  knee    PT Home Exercise Plan Access Code: [email or web address]    Consulted and Agree with Plan of Care Patient             Patient will benefit from skilled therapeutic intervention in order to improve the following deficits and impairments:  Decreased mobility, Difficulty walking, Increased muscle spasms, Decreased range of motion, Increased edema, Decreased activity tolerance, Increased fascial restricitons, Impaired flexibility, Pain  Visit Diagnosis: Acute pain of right knee  Pain in left hip  Stiffness of left hip, not elsewhere classified  Chronic pain of left knee  Other muscle spasm  Acute pain of left knee  Acute low back pain, unspecified back pain laterality, unspecified whether sciatica present     Problem List Patient Active Problem List   Diagnosis Date Noted   Non-recurrent acute suppurative otitis media of right ear without spontaneous rupture of tympanic membrane 06/28/2021   Benign paroxysmal positional vertigo of right ear 06/28/2021   Myofascial pain syndrome of thoracic spine 06/28/2021   Lateral femoral cutaneous entrapment syndrome, right 06/28/2021   Patellofemoral syndrome of both knees 05/24/2021   Plantar fasciitis of right foot 10/26/2020   Pes anserine bursitis 09/21/2020   Snapping hip syndrome, left 07/24/2020   Degenerative tear of medial meniscus of left knee 07/24/2020    Madelyn Flavors, PT 07/11/2021, 6:17 PM  [facility] [address]  [place], [place], [address] Phone: [phone]   Fax:  [phone]  Name: Alexis Mccormick MRN: [id number] Date of Birth: 27-Jul-1985

## 2021-07-14 ENCOUNTER — Other Ambulatory Visit: Payer: Self-pay | Admitting: Pain Medicine

## 2021-07-14 DIAGNOSIS — N644 Mastodynia: Secondary | ICD-10-CM

## 2021-07-16 NOTE — H&P (Signed)
Gynecology History and Physical   Alexis Mccormick is a 36 y.o. female with a history of anemia, uterine fibroids, that presents for scheduled hysteroscopy, D&C and possible myosure for thickened endometrium.  Pelvic US is notable for several uterine fibroids but also endometrium with two suspected polyps.  She has been struggling with abnormal vaginal discharge and changes in odor, despite normal workup.   She denies fever, chills, SOB, CP, nausea, vomiting, diarrhea.   OB History     Gravida  1   Para      Term      Preterm      AB  1   Living  0      SAB      IAB      Ectopic      Multiple      Live Births             Past Medical History:  Diagnosis Date   Anemia    Anxiety    Asthma    Carpal tunnel syndrome    right wrist   Chest pain 05/30/2021   ED visit for heart palpitations, chest pain, sob. 05/30/21 chest xray & EKG normal. Troponin levels normal.See ED OV in Epic from 05/30/21.   Chest pain 07/05/2021   Per 07/05/21 ED note from Dr. Laural Golden at [facility] in [place] chest pain appeared to be musculoskeletal.   COVID 2020   very mild symptoms   Depression    Dizziness 04/2021   Dyspnea 07/05/2021   ED visit for CP & SOB.   Fibroid    Heart palpitations 04/2021   Hypertension    Patient has never been put on BP meds. Cardiology appt 07/19/21 with Dr. Tonia Ghent per pt.   Patellofemoral syndrome of both knees    Plantar fasciitis of right foot    Pre-diabetes    per pt   Wears contact lenses    Wears glasses    Past Surgical History:  Procedure Laterality Date   ESOPHAGOGASTRODUODENOSCOPY  8127   GRAFT APPLICATION  5170   dental bone graft on lower right side   WISDOM TOOTH EXTRACTION  2022   Family History: family history includes Cancer in her maternal grandmother; Diabetes in her maternal grandfather and mother. Social History:  reports that she has never smoked. She has never used  smokeless tobacco. She reports that she does not currently use alcohol. She reports that she does not use drugs.  Review of Systems See HPI History   Blood pressure (!) 155/102, pulse 66, temperature 98.4 F (36.9 C), temperature source Oral, resp. rate 14, height 5\' 4"  (1.626 m), weight 118.1 kg, last menstrual period 07/10/2021, SpO2 100 %. Exam Physical Exam  Gen: alert, well appearing, no distress Chest: nonlabored breathing CV: no peripheral edema Abdomen: soft, nontender Ext: no evidence of DVT  Assessment/Plan: Admit for planned procedure.  Procedure discussed in detail, including risks which include but are not limited to bleeding, infection, pain, uterine perforation. Discussed recovery, and expectations.  Proceed as planned.  Carlyon Shadow 07/17/2021, 7:19 AM

## 2021-07-16 NOTE — Anesthesia Preprocedure Evaluation (Addendum)
Anesthesia Evaluation  Patient identified by MRN, date of birth, ID band Patient awake    Reviewed: Allergy & Precautions, H&P , NPO status , Patient's Chart, lab work & pertinent test results  Airway Mallampati: I  TM Distance: >3 FB Neck ROM: Full    Dental no notable dental hx. (+) Teeth Intact, Dental Advisory Given, Chipped,    Pulmonary asthma ,    Pulmonary exam normal breath sounds clear to auscultation       Cardiovascular Exercise Tolerance: Good hypertension, Normal cardiovascular exam Rhythm:Regular Rate:Normal     Neuro/Psych PSYCHIATRIC DISORDERS Anxiety Depression  Neuromuscular disease negative neurological ROS     GI/Hepatic negative GI ROS, Neg liver ROS,   Endo/Other  negative endocrine ROS  Renal/GU negative Renal ROS  negative genitourinary   Musculoskeletal negative musculoskeletal ROS (+) Arthritis ,   Abdominal   Peds negative pediatric ROS (+)  Hematology  (+) Blood dyscrasia, anemia ,   Anesthesia Other Findings   Reproductive/Obstetrics negative OB ROS                            Anesthesia Physical Anesthesia Plan  ASA: 2  Anesthesia Plan: General   Post-op Pain Management: Minimal or no pain anticipated   Induction: Intravenous  PONV Risk Score and Plan: 3 and Ondansetron, Dexamethasone and Treatment may vary due to age or medical condition  Airway Management Planned: LMA  Additional Equipment: None  Intra-op Plan:   Post-operative Plan:   Informed Consent: I have reviewed the patients History and Physical, chart, labs and discussed the procedure including the risks, benefits and alternatives for the proposed anesthesia with the patient or authorized representative who has indicated his/her understanding and acceptance.       Plan Discussed with: CRNA, Surgeon and Anesthesiologist  Anesthesia Plan Comments: ( )        Anesthesia Quick  Evaluation

## 2021-07-17 ENCOUNTER — Ambulatory Visit (HOSPITAL_BASED_OUTPATIENT_CLINIC_OR_DEPARTMENT_OTHER): Payer: 59 | Admitting: Anesthesiology

## 2021-07-17 ENCOUNTER — Other Ambulatory Visit: Payer: Self-pay

## 2021-07-17 ENCOUNTER — Encounter (HOSPITAL_BASED_OUTPATIENT_CLINIC_OR_DEPARTMENT_OTHER)
Admission: RE | Disposition: A | Discharge status: Home / Self Care | Payer: Self-pay | Source: Home / Self Care | Attending: Obstetrics and Gynecology

## 2021-07-17 ENCOUNTER — Encounter (HOSPITAL_BASED_OUTPATIENT_CLINIC_OR_DEPARTMENT_OTHER): Payer: Self-pay | Admitting: Obstetrics and Gynecology

## 2021-07-17 ENCOUNTER — Ambulatory Visit (HOSPITAL_BASED_OUTPATIENT_CLINIC_OR_DEPARTMENT_OTHER)
Admission: RE | Admit: 2021-07-17 | Discharge: 2021-07-17 | Disposition: A | Discharge status: Home / Self Care | Payer: 59 | Attending: Obstetrics and Gynecology | Admitting: Obstetrics and Gynecology

## 2021-07-17 DIAGNOSIS — D259 Leiomyoma of uterus, unspecified: Secondary | ICD-10-CM | POA: Insufficient documentation

## 2021-07-17 DIAGNOSIS — R9389 Abnormal findings on diagnostic imaging of other specified body structures: Secondary | ICD-10-CM | POA: Diagnosis present

## 2021-07-17 DIAGNOSIS — N898 Other specified noninflammatory disorders of vagina: Secondary | ICD-10-CM

## 2021-07-17 DIAGNOSIS — N840 Polyp of corpus uteri: Secondary | ICD-10-CM | POA: Diagnosis not present

## 2021-07-17 DIAGNOSIS — D649 Anemia, unspecified: Secondary | ICD-10-CM | POA: Diagnosis not present

## 2021-07-17 HISTORY — DX: Prediabetes: R73.03

## 2021-07-17 HISTORY — DX: Carpal tunnel syndrome, unspecified upper limb: G56.00

## 2021-07-17 HISTORY — DX: Presence of spectacles and contact lenses: Z97.3

## 2021-07-17 HISTORY — DX: Plantar fascial fibromatosis: M72.2

## 2021-07-17 HISTORY — PX: DILATATION & CURETTAGE/HYSTEROSCOPY WITH MYOSURE: SHX6511

## 2021-07-17 HISTORY — DX: Patellofemoral disorders, right knee: M22.2X1

## 2021-07-17 LAB — TYPE AND SCREEN
ABO/RH(D): O POS
Antibody Screen: NEGATIVE

## 2021-07-17 LAB — ABO/RH: ABO/RH(D): O POS

## 2021-07-17 LAB — POCT PREGNANCY, URINE: Preg Test, Ur: NEGATIVE

## 2021-07-17 SURGERY — DILATATION & CURETTAGE/HYSTEROSCOPY WITH MYOSURE
Anesthesia: General | Site: Vagina

## 2021-07-17 MED ORDER — ACETAMINOPHEN 325 MG PO TABS: 650.0000 mg | ORAL_TABLET | Freq: Four times a day (QID) | ORAL | 0 refills | Status: DC | PRN

## 2021-07-17 MED ORDER — FENTANYL CITRATE (PF) 100 MCG/2ML IJ SOLN
INTRAMUSCULAR | Status: DC | PRN
  Administered 2021-07-17 (×2): 50 ug via INTRAVENOUS

## 2021-07-17 MED ORDER — MEPERIDINE HCL 25 MG/ML IJ SOLN: 6.2500 mg | INTRAMUSCULAR | Status: DC | PRN

## 2021-07-17 MED ORDER — KETOROLAC TROMETHAMINE 30 MG/ML IJ SOLN
INTRAMUSCULAR | Status: DC | PRN
  Administered 2021-07-17: 30 mg via INTRAVENOUS

## 2021-07-17 MED ORDER — LACTATED RINGERS IV SOLN: INTRAVENOUS | Status: DC

## 2021-07-17 MED ORDER — PROPOFOL 10 MG/ML IV BOLUS
INTRAVENOUS | Status: AC
  Filled 2021-07-17: qty 20

## 2021-07-17 MED ORDER — HYDRALAZINE HCL 20 MG/ML IJ SOLN
INTRAMUSCULAR | Status: DC | PRN
  Administered 2021-07-17: 10 mg via INTRAVENOUS

## 2021-07-17 MED ORDER — OXYCODONE HCL 5 MG/5ML PO SOLN: 5.0000 mg | Freq: Once | ORAL | Status: DC | PRN

## 2021-07-17 MED ORDER — FENTANYL CITRATE (PF) 100 MCG/2ML IJ SOLN: 25.0000 ug | INTRAMUSCULAR | Status: DC | PRN

## 2021-07-17 MED ORDER — POVIDONE-IODINE 10 % EX SWAB: 2.0000 "application " | Freq: Once | CUTANEOUS | Status: DC

## 2021-07-17 MED ORDER — PROPOFOL 10 MG/ML IV BOLUS
INTRAVENOUS | Status: DC | PRN
  Administered 2021-07-17: 200 mg via INTRAVENOUS

## 2021-07-17 MED ORDER — ACETAMINOPHEN 160 MG/5ML PO SOLN: 325.0000 mg | ORAL | Status: DC | PRN

## 2021-07-17 MED ORDER — LIDOCAINE HCL (CARDIAC) PF 100 MG/5ML IV SOSY
PREFILLED_SYRINGE | INTRAVENOUS | Status: DC | PRN
Start: 2021-07-17 — End: 2021-07-17
  Administered 2021-07-17: 80 mg via INTRAVENOUS

## 2021-07-17 MED ORDER — FENTANYL CITRATE (PF) 100 MCG/2ML IJ SOLN
INTRAMUSCULAR | Status: AC
  Filled 2021-07-17: qty 2

## 2021-07-17 MED ORDER — IBUPROFEN 800 MG PO TABS: 800.0000 mg | ORAL_TABLET | Freq: Three times a day (TID) | ORAL | 0 refills | Status: DC | PRN

## 2021-07-17 MED ORDER — HYDRALAZINE HCL 20 MG/ML IJ SOLN
INTRAMUSCULAR | Status: AC
  Filled 2021-07-17: qty 1

## 2021-07-17 MED ORDER — DEXAMETHASONE SODIUM PHOSPHATE 10 MG/ML IJ SOLN
INTRAMUSCULAR | Status: AC
  Filled 2021-07-17: qty 1

## 2021-07-17 MED ORDER — ONDANSETRON HCL 4 MG/2ML IJ SOLN
INTRAMUSCULAR | Status: DC | PRN
  Administered 2021-07-17: 4 mg via INTRAVENOUS

## 2021-07-17 MED ORDER — MIDAZOLAM HCL 2 MG/2ML IJ SOLN
INTRAMUSCULAR | Status: AC
  Filled 2021-07-17: qty 2

## 2021-07-17 MED ORDER — MIDAZOLAM HCL 5 MG/5ML IJ SOLN
INTRAMUSCULAR | Status: DC | PRN
  Administered 2021-07-17: 2 mg via INTRAVENOUS

## 2021-07-17 MED ORDER — ONDANSETRON HCL 4 MG/2ML IJ SOLN: 4.0000 mg | Freq: Once | INTRAMUSCULAR | Status: DC | PRN

## 2021-07-17 MED ORDER — LIDOCAINE HCL (PF) 2 % IJ SOLN
INTRAMUSCULAR | Status: AC
  Filled 2021-07-17: qty 5

## 2021-07-17 MED ORDER — KETOROLAC TROMETHAMINE 30 MG/ML IJ SOLN
INTRAMUSCULAR | Status: AC
  Filled 2021-07-17: qty 1

## 2021-07-17 MED ORDER — OXYCODONE HCL 5 MG PO TABS: 5.0000 mg | ORAL_TABLET | Freq: Once | ORAL | Status: DC | PRN

## 2021-07-17 MED ORDER — SODIUM CHLORIDE 0.9 % IR SOLN
Status: DC | PRN
  Administered 2021-07-17: 3000 mL
  Administered 2021-07-17: 1000 mL

## 2021-07-17 MED ORDER — LIDOCAINE HCL 1 % IJ SOLN
INTRAMUSCULAR | Status: DC | PRN
  Administered 2021-07-17: 3 mL

## 2021-07-17 MED ORDER — DEXAMETHASONE SODIUM PHOSPHATE 4 MG/ML IJ SOLN
INTRAMUSCULAR | Status: DC | PRN
  Administered 2021-07-17: 10 mg via INTRAVENOUS

## 2021-07-17 MED ORDER — ONDANSETRON HCL 4 MG/2ML IJ SOLN
INTRAMUSCULAR | Status: AC
  Filled 2021-07-17: qty 2

## 2021-07-17 MED ORDER — ACETAMINOPHEN 325 MG PO TABS: 325.0000 mg | ORAL_TABLET | ORAL | Status: DC | PRN

## 2021-07-17 SURGICAL SUPPLY — 16 items
DEVICE MYOSURE REACH (MISCELLANEOUS) ×1 IMPLANT
GLOVE SURG LTX SZ7 (GLOVE) ×4 IMPLANT
GLOVE SURG NEOPR MICRO LF SZ6 (GLOVE) ×2 IMPLANT
GLOVE SURG UNDER POLY LF SZ7 (GLOVE) ×3 IMPLANT
GOWN STRL REUS W/TWL LRG LVL3 (GOWN DISPOSABLE) ×5 IMPLANT
IV NS 1000ML (IV SOLUTION) ×2
IV NS 1000ML BAXH (IV SOLUTION) IMPLANT
IV NS IRRIG 3000ML ARTHROMATIC (IV SOLUTION) ×1 IMPLANT
KIT PROCEDURE FLUENT (KITS) ×2 IMPLANT
KIT TURNOVER CYSTO (KITS) ×2 IMPLANT
PACK VAGINAL MINOR WOMEN LF (CUSTOM PROCEDURE TRAY) ×2 IMPLANT
PAD OB MATERNITY 4.3X12.25 (PERSONAL CARE ITEMS) ×2 IMPLANT
SEAL ROD LENS SCOPE MYOSURE (ABLATOR) ×2 IMPLANT
SOL PREP POV-IOD 4OZ 10% (MISCELLANEOUS) ×1 IMPLANT
TOWEL OR 17X26 10 PK STRL BLUE (TOWEL DISPOSABLE) ×3 IMPLANT
UNDERPAD 30X36 HEAVY ABSORB (UNDERPADS AND DIAPERS) ×2 IMPLANT

## 2021-07-17 NOTE — Anesthesia Postprocedure Evaluation (Signed)
Anesthesia Post Note  Patient: Alexis Mccormick  Procedure(s) Performed: DILATATION & CURETTAGE/HYSTEROSCOPY WITH MYOSURE (Vagina )     Patient location during evaluation: PACU Anesthesia Type: General Level of consciousness: awake and alert Pain management: pain level controlled Vital Signs Assessment: post-procedure vital signs reviewed and stable Respiratory status: spontaneous breathing, nonlabored ventilation, respiratory function stable and patient connected to nasal cannula oxygen Cardiovascular status: blood pressure returned to baseline and stable Postop Assessment: no apparent nausea or vomiting Anesthetic complications: no   No notable events documented.  Last Vitals:  Vitals:   07/17/21 0830 07/17/21 0900  BP: (!) 178/89 (!) 147/75  Pulse: 91 84  Resp: 19 20  Temp:  36.5 C  SpO2: 99% 100%    Last Pain:  Vitals:   07/17/21 0900  TempSrc:   PainSc: 0-No pain                 Torin Whisner

## 2021-07-17 NOTE — Discharge Instructions (Signed)
  Post Anesthesia Home Care Instructions  Activity: Get plenty of rest for the remainder of the day. A responsible individual must stay with you for 24 hours following the procedure.  For the next 24 hours, DO NOT: -Drive a car -Operate machinery -Drink alcoholic beverages -Take any medication unless instructed by your physician -Make any legal decisions or sign important papers.  Meals: Start with liquid foods such as gelatin or soup. Progress to regular foods as tolerated. Avoid greasy, spicy, heavy foods. If nausea and/or vomiting occur, drink only clear liquids until the nausea and/or vomiting subsides. Call your physician if vomiting continues.  Special Instructions/Symptoms: Your throat may feel dry or sore from the anesthesia or the breathing tube placed in your throat during surgery. If this causes discomfort, gargle with warm salt water. The discomfort should disappear within 24 hours.  If you had a scopolamine patch placed behind your ear for the management of post- operative nausea and/or vomiting:  1. The medication in the patch is effective for 72 hours, after which it should be removed.  Wrap patch in a tissue and discard in the trash. Wash hands thoroughly with soap and water. 2. You may remove the patch earlier than 72 hours if you experience unpleasant side effects which may include dry mouth, dizziness or visual disturbances. 3. Avoid touching the patch. Wash your hands with soap and water after contact with the patch.      D & C Home care Instructions:   Personal hygiene:  Used sanitary napkins for vaginal drainage not tampons. Shower or tub bathe the day after your procedure. No douching until bleeding stops. Always wipe from front to back after  Elimination.  Activity: Do not drive or operate any equipment today. The effects of the anesthesia are still present and drowsiness may result. Rest today, not necessarily flat bed rest, just take it easy. You may resume your  normal activity in one to 2 days.  Sexual activity: No intercourse for one week or as indicated by your physician  Diet: Eat a light diet as desired this evening. You may resume a regular diet tomorrow.  Return to work: One to 2 days.  General Expectations of your surgery: Vaginal bleeding should be no heavier than a normal period. Spotting may continue up to 10 days. Mild cramps may continue for a couple of days. You may have a regular period in 2-6 weeks.  Unexpected observations call your doctor if these occur: persistent or heavy bleeding. Severe abdominal cramping or pain. Elevation of temperature greater than 100F.  Call for an appointment in one week.   

## 2021-07-17 NOTE — Transfer of Care (Addendum)
Immediate Anesthesia Transfer of Care Note  Patient: Alexis Mccormick  Procedure(s) Performed: Procedure(s) (LRB): DILATATION & CURETTAGE/HYSTEROSCOPY WITH MYOSURE (N/A)  Patient Location: PACU  Anesthesia Type: General  Level of Consciousness: awake, sedated, patient cooperative and responds to stimulation  Airway & Oxygen Therapy: Patient Spontanous Breathing and Patient connected to [place] 02 and soft FM   Post-op Assessment: Report given to PACU RN, Post -op Vital signs reviewed and stable and Patient moving all extremities  Post vital signs: Reviewed and stable  Complications: No apparent anesthesia complications

## 2021-07-17 NOTE — Anesthesia Procedure Notes (Signed)
Procedure Name: LMA Insertion Date/Time: 07/17/2021 7:37 AM Performed by: Justice Rocher, CRNA Pre-anesthesia Checklist: Patient identified, Emergency Drugs available, Suction available, Patient being monitored and Timeout performed Patient Re-evaluated:Patient Re-evaluated prior to induction Oxygen Delivery Method: Circle system utilized Preoxygenation: Pre-oxygenation with 100% oxygen Induction Type: IV induction Ventilation: Mask ventilation without difficulty LMA: LMA inserted LMA Size: 4.0 Number of attempts: 1 Airway Equipment and Method: Bite block Placement Confirmation: positive ETCO2, breath sounds checked- equal and bilateral and CO2 detector Tube secured with: Tape Dental Injury: Teeth and Oropharynx as per pre-operative assessment

## 2021-07-17 NOTE — Op Note (Signed)
DATE OF PROCEDURE: 07/17/2021  PREOPERATIVE DIAGNOSIS: Thickened endometrium, known uterine fibroids and suspected endometrial polyp  POSTOPERATIVE DIAGNOSIS: Same  PROCEDURE PERFORMED: Hytseroscopy with myosure polypectomy  SURGEON: Alpha Gula MD  ANESTHESIA: General  ESTIMATED BLOOD LOSS: 10 cc  URINE OUTPUT: See anesthesia record  COMPLICATIONS: None   DRAINS: None  FINDINGS: On exam, under anesthesia, normal appearing vulva and vagina, a normal sized uterus and normal appearing cervix.  On hysteroscopy, endometrial cavity with two polyps in lower uterine segment.  Bilateral patent tubal ostia.  Possible band of scarring noted at uterine fundus but no adhesions.  Procedure: A general anesthesia was induced and the patient was placed in the dirsal lithotomy position. The vagina and perineum were prepped with betadine solution.  A speculum was placed to visualize the cervix, the cervix was grasped with single tooth tenaculum on the anterior lip.  Uterine sound was used and cavity measured 7 cm.  The cervix was progressively dilated to accommodate a standard hysteroscope.  The hysteroscope was placed and uterine cavity distended with saline to reveal the above findings, including two polyps.  Myosure reach was then utilized to remove both without difficulty.  No fibroids or distortion of the uterine cavity were apparent.    The remaining uterine lining and cavity were normal in appearance and thus D&C was deferred.   Hysteroscope was then removed and cavity drained of fluid.  The tenaculum sites were hemostatic.  The patient's procedure was completed. We then awakened her. She was sent to the Recovery Room in good condition.   Alpha Gula MD

## 2021-07-18 LAB — SURGICAL PATHOLOGY

## 2021-07-19 ENCOUNTER — Encounter (HOSPITAL_BASED_OUTPATIENT_CLINIC_OR_DEPARTMENT_OTHER): Payer: Self-pay | Admitting: Obstetrics and Gynecology

## 2021-07-21 ENCOUNTER — Other Ambulatory Visit: Payer: Self-pay

## 2021-07-21 ENCOUNTER — Emergency Department (HOSPITAL_BASED_OUTPATIENT_CLINIC_OR_DEPARTMENT_OTHER): Payer: 59 | Admitting: Radiology

## 2021-07-21 ENCOUNTER — Encounter (HOSPITAL_BASED_OUTPATIENT_CLINIC_OR_DEPARTMENT_OTHER): Payer: Self-pay

## 2021-07-21 ENCOUNTER — Emergency Department (HOSPITAL_BASED_OUTPATIENT_CLINIC_OR_DEPARTMENT_OTHER): Payer: 59

## 2021-07-21 ENCOUNTER — Emergency Department (HOSPITAL_BASED_OUTPATIENT_CLINIC_OR_DEPARTMENT_OTHER)
Admission: EM | Admit: 2021-07-21 | Discharge: 2021-07-21 | Disposition: A | Discharge status: Home / Self Care | Payer: 59 | Attending: Emergency Medicine | Admitting: Emergency Medicine

## 2021-07-21 DIAGNOSIS — Z8616 Personal history of COVID-19: Secondary | ICD-10-CM | POA: Insufficient documentation

## 2021-07-21 DIAGNOSIS — M546 Pain in thoracic spine: Secondary | ICD-10-CM | POA: Insufficient documentation

## 2021-07-21 DIAGNOSIS — Z79899 Other long term (current) drug therapy: Secondary | ICD-10-CM | POA: Insufficient documentation

## 2021-07-21 DIAGNOSIS — R1013 Epigastric pain: Secondary | ICD-10-CM | POA: Insufficient documentation

## 2021-07-21 DIAGNOSIS — R0789 Other chest pain: Secondary | ICD-10-CM | POA: Insufficient documentation

## 2021-07-21 DIAGNOSIS — J45909 Unspecified asthma, uncomplicated: Secondary | ICD-10-CM | POA: Insufficient documentation

## 2021-07-21 DIAGNOSIS — R079 Chest pain, unspecified: Secondary | ICD-10-CM | POA: Diagnosis present

## 2021-07-21 DIAGNOSIS — I10 Essential (primary) hypertension: Secondary | ICD-10-CM | POA: Diagnosis not present

## 2021-07-21 DIAGNOSIS — R42 Dizziness and giddiness: Secondary | ICD-10-CM | POA: Insufficient documentation

## 2021-07-21 LAB — CBC WITH DIFFERENTIAL/PLATELET
Abs Immature Granulocytes: 0.01 10*3/uL (ref 0.00–0.07)
Basophils Absolute: 0 10*3/uL (ref 0.0–0.1)
Basophils Relative: 0 %
Eosinophils Absolute: 0.1 10*3/uL (ref 0.0–0.5)
Eosinophils Relative: 1 %
HCT: 33.7 % — ABNORMAL LOW (ref 36.0–46.0)
Hemoglobin: 10.6 g/dL — ABNORMAL LOW (ref 12.0–15.0)
Immature Granulocytes: 0 %
Lymphocytes Relative: 27 %
Lymphs Abs: 1.4 10*3/uL (ref 0.7–4.0)
MCH: 25.1 pg — ABNORMAL LOW (ref 26.0–34.0)
MCHC: 31.5 g/dL (ref 30.0–36.0)
MCV: 79.9 fL — ABNORMAL LOW (ref 80.0–100.0)
Monocytes Absolute: 0.3 10*3/uL (ref 0.1–1.0)
Monocytes Relative: 7 %
Neutro Abs: 3.4 10*3/uL (ref 1.7–7.7)
Neutrophils Relative %: 65 %
Platelets: 276 10*3/uL (ref 150–400)
RBC: 4.22 MIL/uL (ref 3.87–5.11)
RDW: 15.5 % (ref 11.5–15.5)
WBC: 5.2 10*3/uL (ref 4.0–10.5)
nRBC: 0 % (ref 0.0–0.2)

## 2021-07-21 LAB — COMPREHENSIVE METABOLIC PANEL
ALT: 9 U/L (ref 0–44)
AST: 11 U/L — ABNORMAL LOW (ref 15–41)
Albumin: 3.7 g/dL (ref 3.5–5.0)
Alkaline Phosphatase: 48 U/L (ref 38–126)
Anion gap: 7 (ref 5–15)
BUN: 9 mg/dL (ref 6–20)
CO2: 27 mmol/L (ref 22–32)
Calcium: 8.8 mg/dL — ABNORMAL LOW (ref 8.9–10.3)
Chloride: 104 mmol/L (ref 98–111)
Creatinine, Ser: 0.69 mg/dL (ref 0.44–1.00)
GFR, Estimated: >60 mL/min (ref >60)
Glucose, Bld: 92 mg/dL (ref 70–99)
Potassium: 3.8 mmol/L (ref 3.5–5.1)
Sodium: 138 mmol/L (ref 135–145)
Total Bilirubin: 0.3 mg/dL (ref 0.3–1.2)
Total Protein: 7 g/dL (ref 6.5–8.1)

## 2021-07-21 LAB — PREGNANCY, URINE: Preg Test, Ur: NEGATIVE

## 2021-07-21 LAB — TROPONIN I (HIGH SENSITIVITY): Troponin I (High Sensitivity): <2 ng/L (ref <18)

## 2021-07-21 LAB — LIPASE, BLOOD: Lipase: <10 U/L — ABNORMAL LOW (ref 11–51)

## 2021-07-21 IMAGING — DX DG CHEST 2V
2 series · 2 of 2 positions shown · non-contrast
Comparison: [DATE].

CLINICAL DATA: Chest pain for 3 months.  No injury.

EXAM:
CHEST - 2 VIEW

[chest pa]
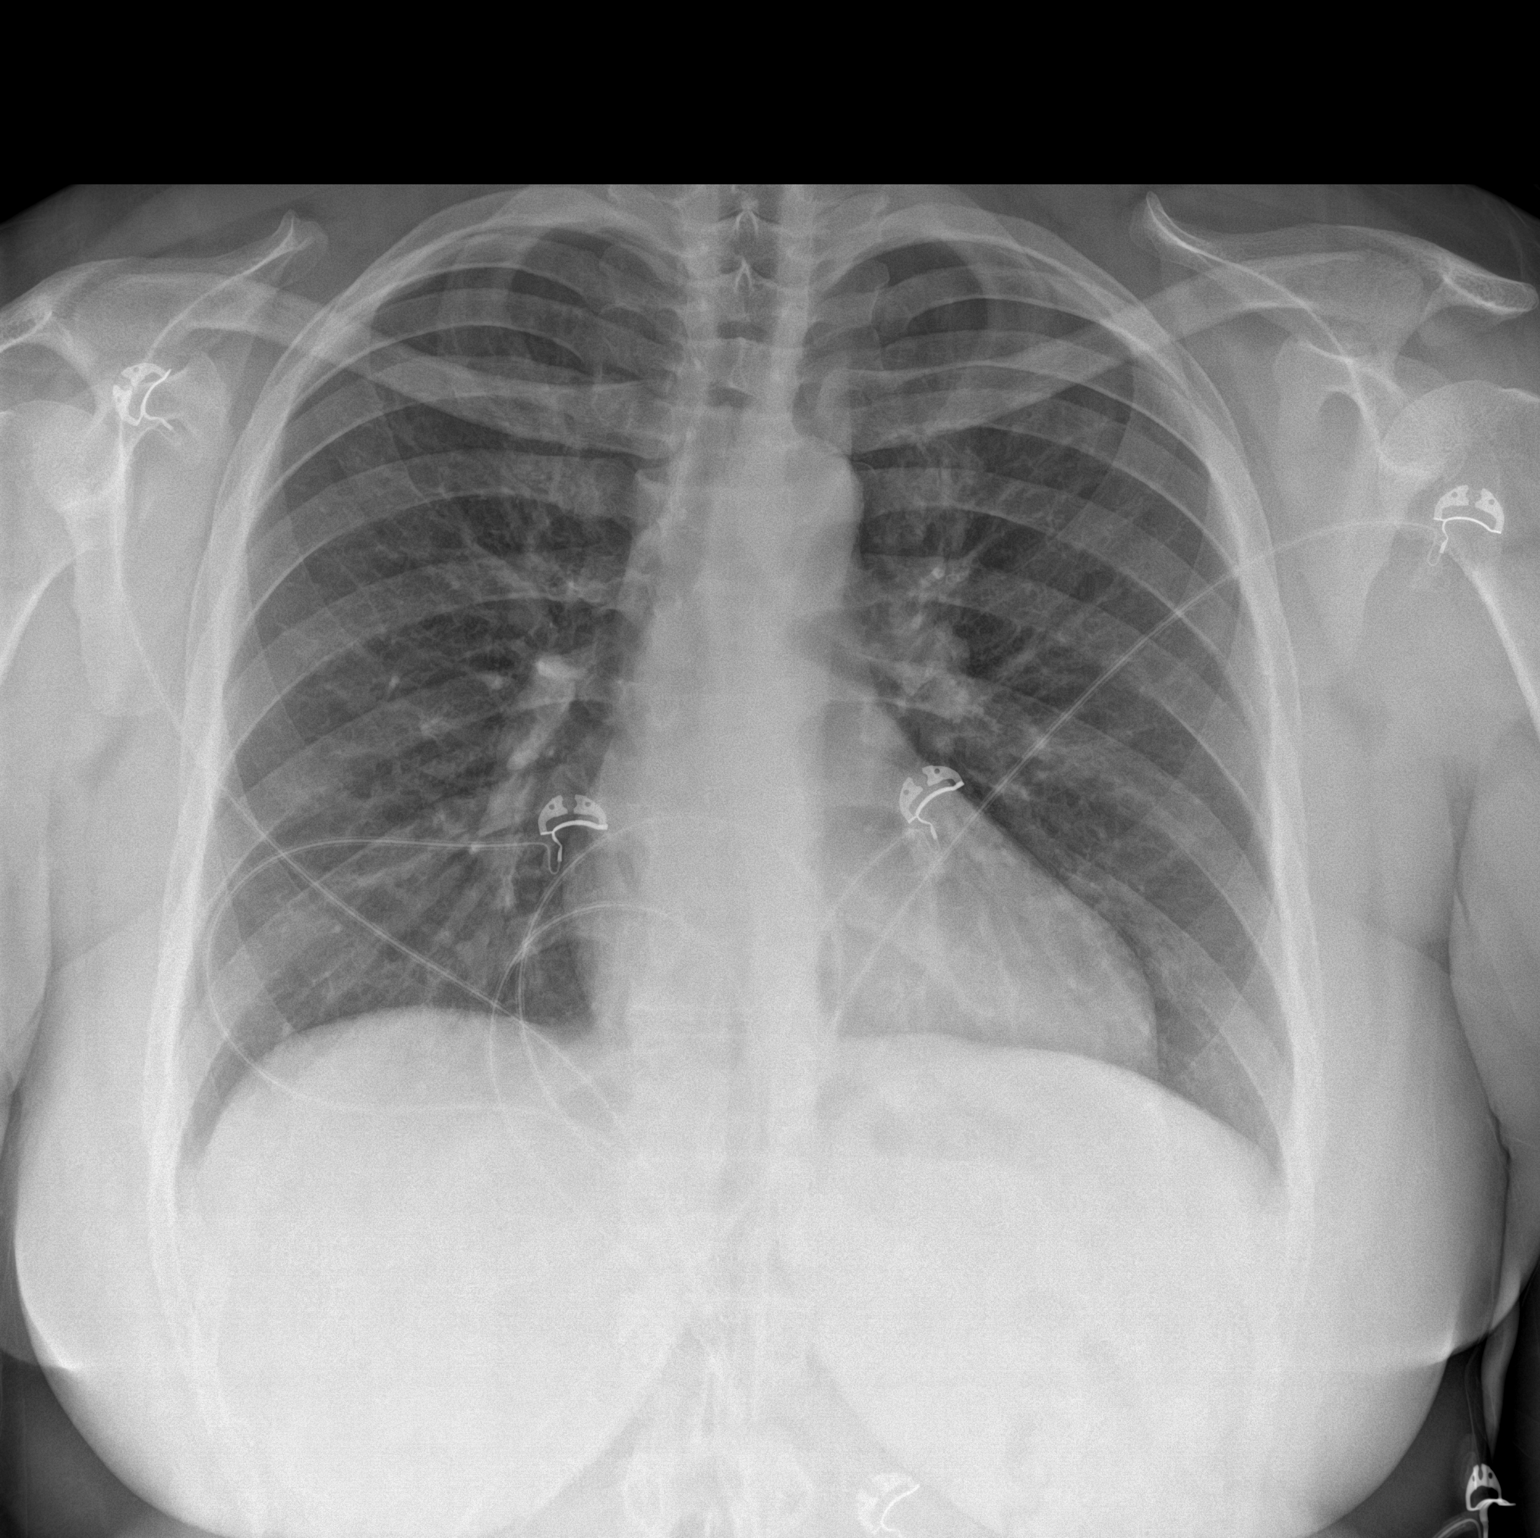

[chest lat]
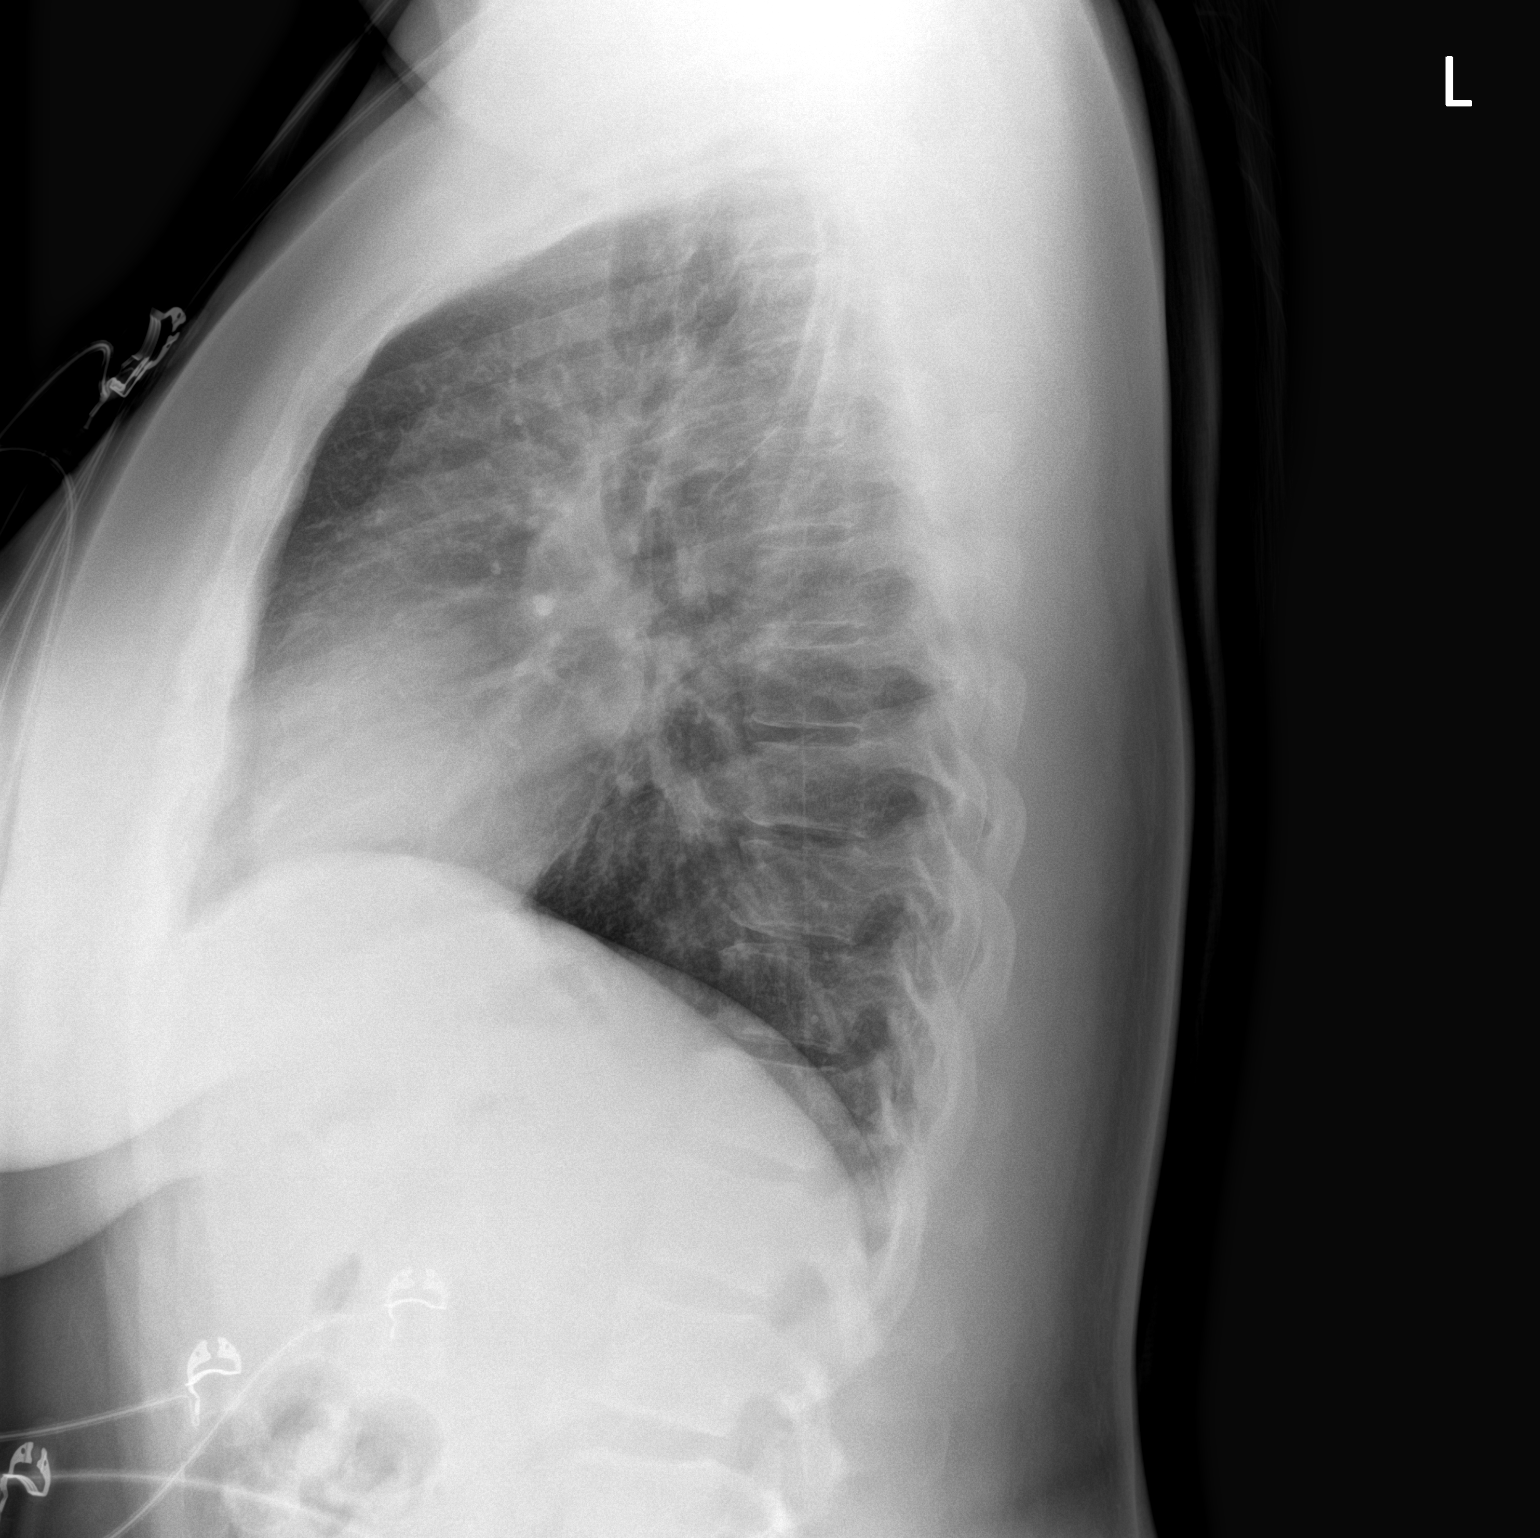

[2 of 2 positions shown; findings below may reference images not displayed]

FINDINGS: Normal heart, mediastinum and hila.

Clear lungs.  No pleural effusion or pneumothorax.

Skeletal structures are within normal limits.
IMPRESSION: Normal chest radiographs.

## 2021-07-21 IMAGING — CT CT HEAD W/O CM
4 series · 17 of 47 positions shown, 19 images · non-contrast
Comparison: None.

CLINICAL DATA: Headache for 2 days. History of controlled
hypertension.



[Series 2: head wo · axial · 0.45mm/px · z∈[-155,-40]mm · 7 of 31 slices shown, 9 images]
[im 4/31  brain]
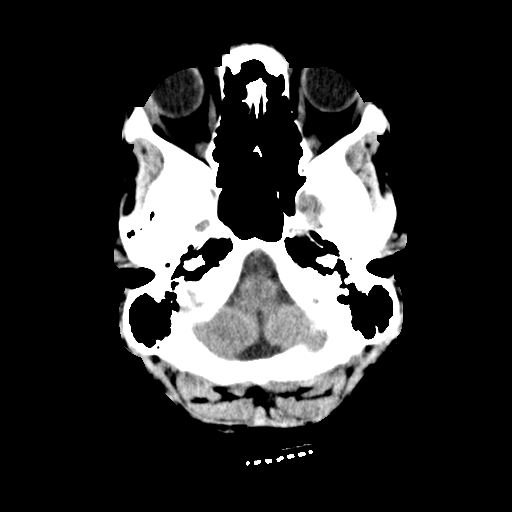
[im 4/31  bone]
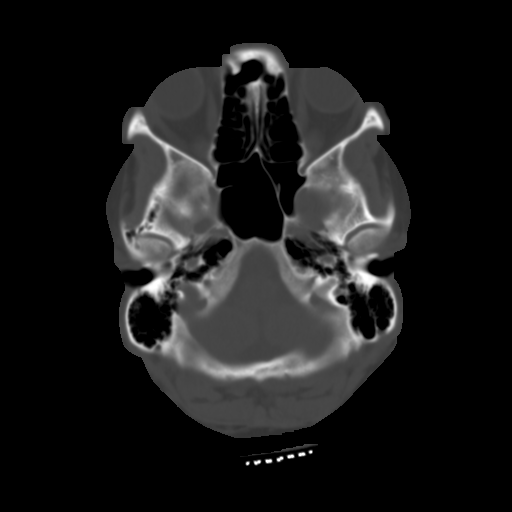
[im 8/31  brain]
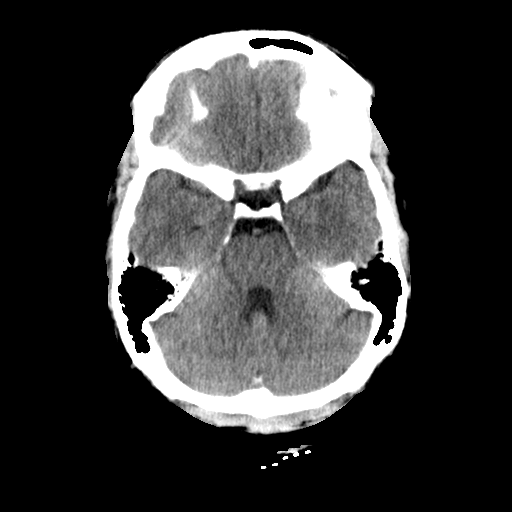
[im 12/31  brain]
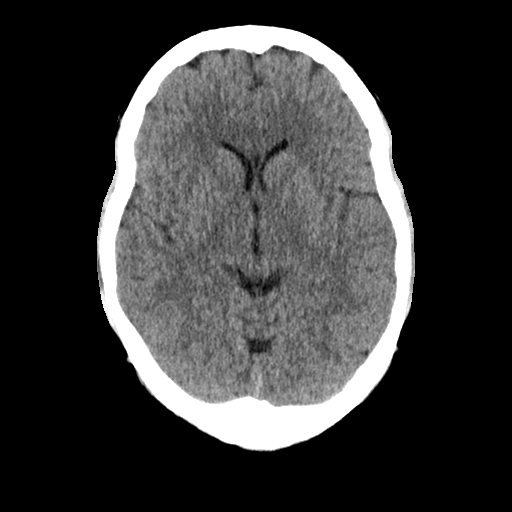
[im 16/31  brain]
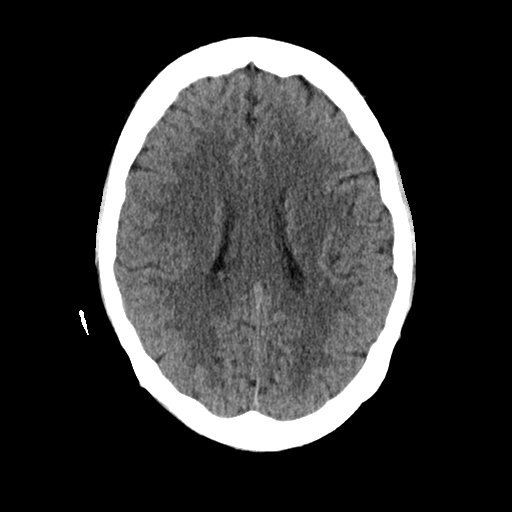
[im 19/31  brain]
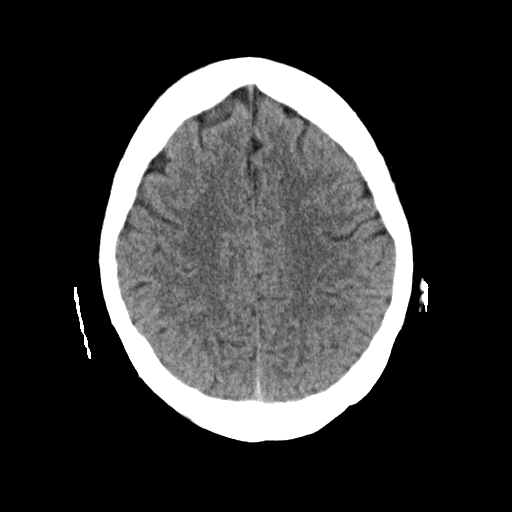
[im 19/31  bone]
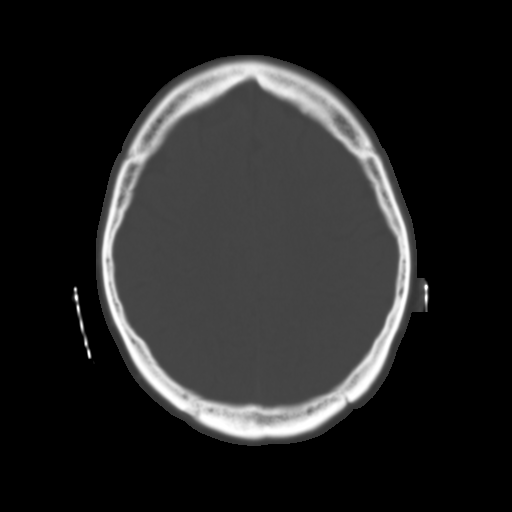
[im 23/31  brain]
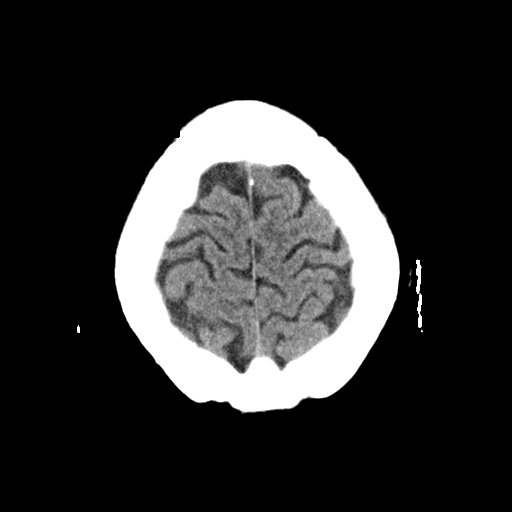
[im 27/31  brain]
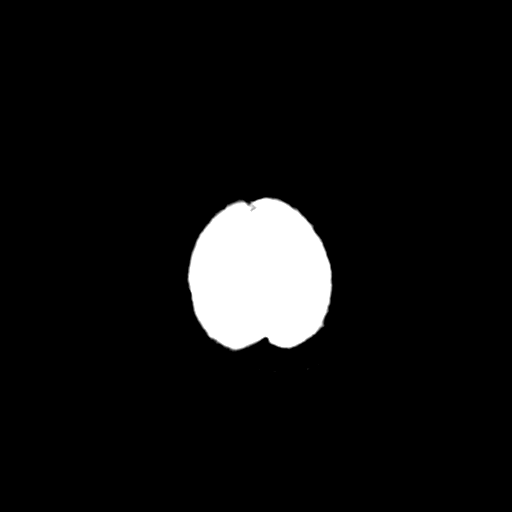

[Series 3: head bone · axial · 0.45mm/px · z∈[-156,-102]mm · 4 of 78 slices shown]
[im 8/78  bone]
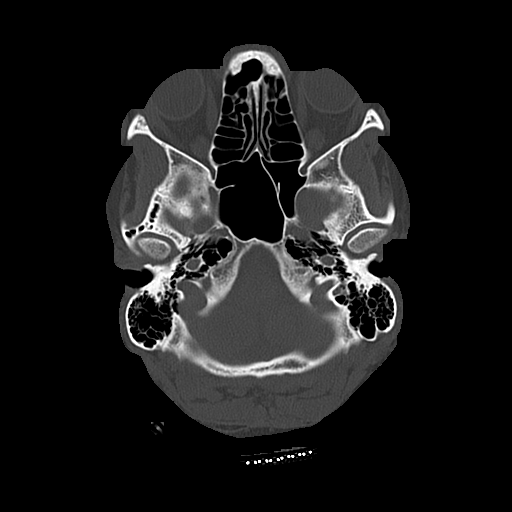
[im 16/78  bone]
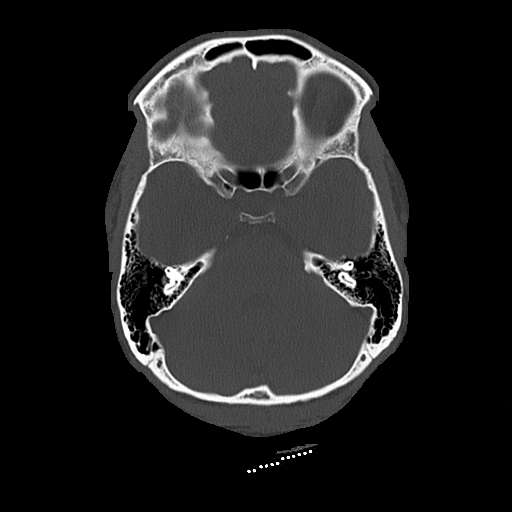
[im 24/78  bone]
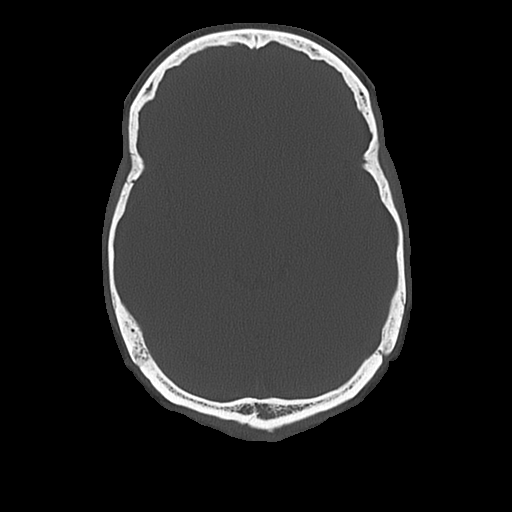
[im 35/78  bone]
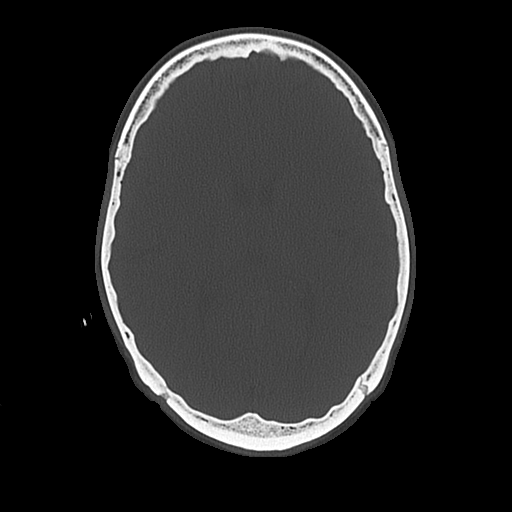

[Series 4: coronal soft · coronal · 0.35mm/px · 3 of 73 slices shown]
[im 25/73  brain]
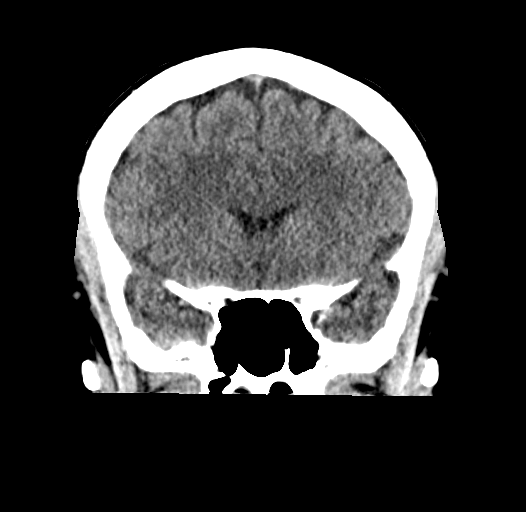
[im 33/73  brain]
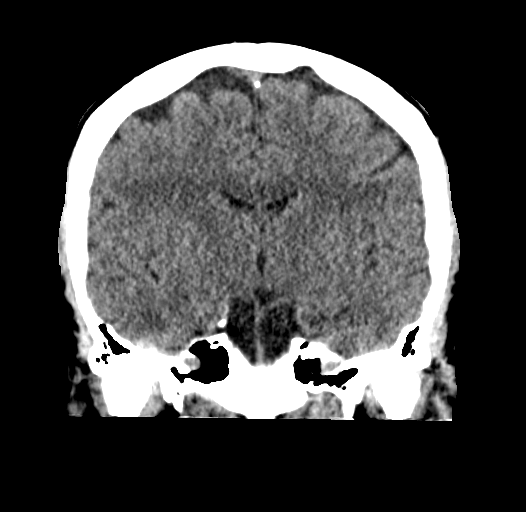
[im 41/73  brain]
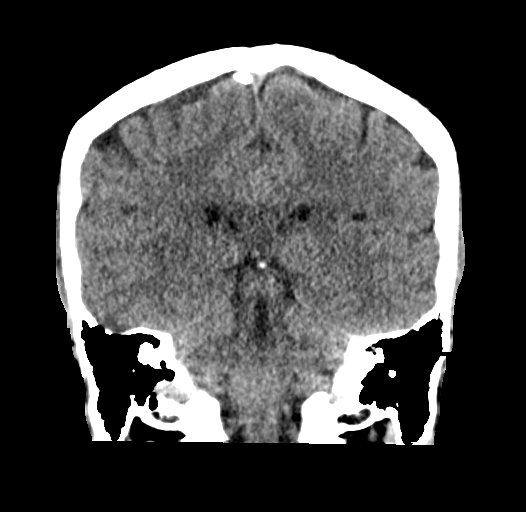

[Series 5: sagittal soft · sagittal · 0.35mm/px · 3 of 56 slices shown]
[im 19/56  brain]
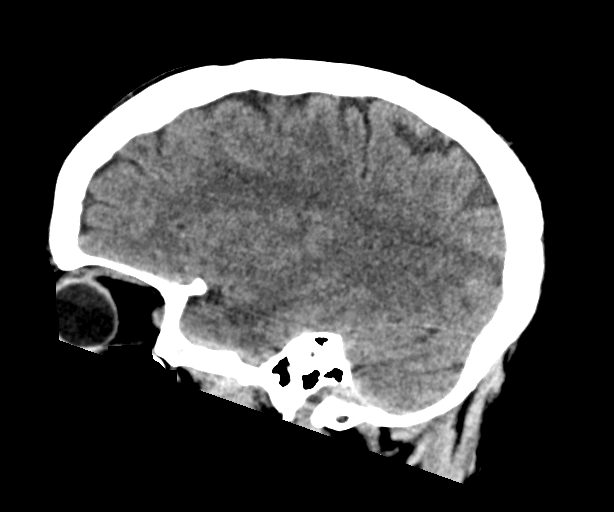
[im 28/56  brain]
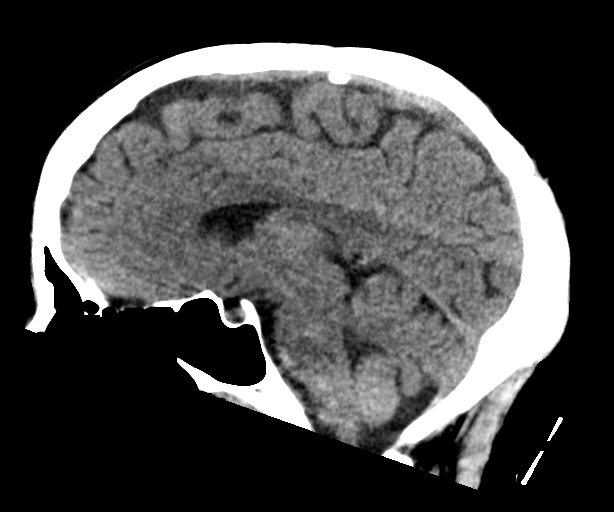
[im 37/56  brain]
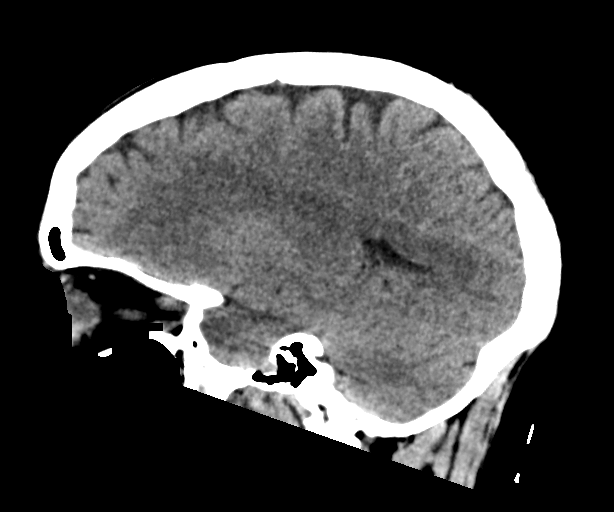

[17 of 47 positions shown; findings below may reference images not displayed]

FINDINGS: Brain: No evidence of acute infarction, hemorrhage, hydrocephalus,
extra-axial collection or mass lesion/mass effect.

Vascular: No hyperdense vessel or unexpected calcification.

Skull: Normal. Negative for fracture or focal lesion.

Sinuses/Orbits: Normal globes and orbits. Visualized sinuses are
clear.

Other: None.
IMPRESSION: Normal unenhanced CT scan of the brain.

## 2021-07-21 MED ORDER — AMLODIPINE BESYLATE 5 MG PO TABS: 5.0000 mg | ORAL_TABLET | Freq: Every day | ORAL | 0 refills | Status: DC

## 2021-07-21 MED ORDER — CYCLOBENZAPRINE HCL 10 MG PO TABS: 10.0000 mg | ORAL_TABLET | Freq: Two times a day (BID) | ORAL | 0 refills | Status: DC | PRN

## 2021-07-21 MED ORDER — KETOROLAC TROMETHAMINE 30 MG/ML IJ SOLN
30.0000 mg | Freq: Once | INTRAMUSCULAR | Status: AC
  Administered 2021-07-21: 30 mg via INTRAVENOUS

## 2021-07-21 MED ORDER — CYCLOBENZAPRINE HCL 10 MG PO TABS
10.0000 mg | ORAL_TABLET | Freq: Once | ORAL | Status: AC
  Administered 2021-07-21: 10 mg via ORAL

## 2021-07-21 NOTE — ED Notes (Signed)
Patient states she does not want EKG performed.  MD and Ronita Hipps, RN notified.

## 2021-07-21 NOTE — ED Triage Notes (Signed)
She c/o pain at "All of my ribs and front and back" gradually worsening, since Nov. Of 2022. She is in no distress. Her female significant other is with her.

## 2021-07-21 NOTE — ED Provider Notes (Signed)
[facility]ERGENC[facility] DEPT Provider Note   CSN: [id number] Arrival date & time: 07/21/21  2202     History  Chief Complaint  Patient presents with   Thoracic pain    Alexis Mccormick is a 36 y.o. female.  HPI     36 year old female with history of elevated blood pressures without official diagnosis of hypertension at this time, hyperlipidemia, anxiety, proteinuria for which she has been seen by nephrology, recent hysteroscopy February 7 with symptoms of chronic dysuria and vaginitis, presents with concern for chest pain.  Reports he had a similar episode of chest pain in November.  Describes it is not heart pain, but more pain of the rib cage.  She reports it feels like inflammation of her rib cage in the middle with extension wrapping around her ribs bilaterally.  It feels like more of an aching pain that is there when someone pushes on it.  She says is worse when she lays on the area or if it is touched.  It does not worsen with arm movements and deep breaths.  There is no associated shortness of breath, nausea, vomiting, diaphoresis, or lightheadedness.  Reports that she has had dizziness over the last several months, as well as visual changes in the right eye over the last several months--she was evaluated by an eye doctor who told her that she was farsighted in 1 eye and nearsighted in the other eye and adjusted her prescription.  She has not had visual field changes.  Denies any change in the symptoms.  Does report that she has some headaches, including a mild headache today.  Her symptoms of chest pain began at 2 AM and she had a difficult time sleeping.  She last took ibuprofen at 10 PM last night.  No fever, cough, congestion, sore throat, abdominal pain.  Does not feel like the pain is worse after eating.  Denies a family history of early heart disease, other recent surgeries or immobilization or trips with the exception of the hysteroscopy.  Denies a history of  smoking, alcohol or drugs.  Does note that since the hysteroscopy she has had foul-smelling discharge.    Past Medical History:  Diagnosis Date   Anemia    Anxiety    Asthma    Carpal tunnel syndrome    right wrist   Chest pain 05/30/2021   ED visit for heart palpitations, chest pain, sob. 05/30/21 chest xray & EKG normal. Troponin levels normal.See ED OV in Epic from 05/30/21.   Chest pain 07/05/2021   Per 07/05/21 ED note from Dr. Laural Golden at [facility] in [place] chest pain appeared to be musculoskeletal.   COVID 2020   very mild symptoms   Depression    Dizziness 04/2021   Dyspnea 07/05/2021   ED visit for CP & SOB.   Fibroid    Heart palpitations 04/2021   Hypertension    Patient has never been put on BP meds. Cardiology appt 07/19/21 with Dr. Tonia Ghent per pt.   Patellofemoral syndrome of both knees    Plantar fasciitis of right foot    Pre-diabetes    per pt   Wears contact lenses    Wears glasses      Home Medications Prior to Admission medications   Medication Sig Start Date End Date Taking? Authorizing Provider  amLODipine (NORVASC) 5 MG tablet Take 1 tablet (5 mg total) by mouth daily. 07/21/21 08/20/21 Yes Gareth Morgan, MD  cyclobenzaprine (  FLEXERIL) 10 MG tablet Take 1 tablet (10 mg total) by mouth 2 (two) times daily as needed for muscle spasms. 07/21/21  Yes Gareth Morgan, MD  acetaminophen (TYLENOL) 325 MG tablet Take 2 tablets (650 mg total) by mouth every 6 (six) hours as needed. 07/17/21   Carlyon Shadow, MD  albuterol (PROVENTIL HFA;VENTOLIN HFA) 108 (90 Base) MCG/ACT inhaler Inhale 2 puffs into the lungs every 6 (six) hours as needed for wheezing or shortness of breath. 11/27/17 04/14/21  Leath-Warren, Alda Lea, NP  ALPRAZolam Duanne Moron) 0.25 MG tablet Take 0.25 mg by mouth as needed for anxiety.    [provider]  ibuprofen (ADVIL) 800 MG tablet Take 1 tablet (800 mg total) by mouth every 8 (eight)  hours as needed. 07/17/21   Carlyon Shadow, MD  Iron-FA-B Cmp-C-Biot-Probiotic (FUSION PLUS PO) Take by mouth. Patient needs to pick up prescription. Hasn't started yet. 07/10/21.    [provider]  Multiple Vitamin (MULTIVITAMIN WITH MINERALS) TABS tablet Take 1 tablet by mouth daily. Gummies    [provider]  Vitamin D, Ergocalciferol, (DRISDOL) 1.25 MG (50000 UNIT) CAPS capsule Take 50,000 Units by mouth once a week. 02/15/21   [provider]      Allergies    Cefdinir and Strawberry (diagnostic)    Review of Systems   Review of Systems  Physical Exam Updated Vital Signs BP (!) 148/83    Pulse 63    Temp 97.7 F (36.5 C)    Resp (!) 24    LMP 07/10/2021 (Exact Date)    SpO2 100%  Physical Exam Vitals and nursing note reviewed.  Constitutional:      General: She is not in acute distress.    Appearance: She is well-developed. She is not diaphoretic.  HENT:     Head: Normocephalic and atraumatic.  Eyes:     Conjunctiva/sclera: Conjunctivae normal.  Cardiovascular:     Rate and Rhythm: Normal rate and regular rhythm.     Heart sounds: Normal heart sounds. No murmur heard.   No friction rub. No gallop.  Pulmonary:     Effort: Pulmonary effort is normal. No respiratory distress.     Breath sounds: Normal breath sounds. No wheezing or rales.  Chest:     Chest wall: Tenderness present.  Abdominal:     General: There is no distension.     Palpations: Abdomen is soft.     Tenderness: There is abdominal tenderness (mild epigastric, neg murphy's, no sig right sided tenderness). There is no guarding.  Musculoskeletal:        General: No tenderness.     Cervical back: Normal range of motion.  Skin:    General: Skin is warm and dry.     Findings: No erythema or rash.  Neurological:     Mental Status: She is alert and oriented to person, place, and time.     Comments: Normal visual fields    ED Results / Procedures / Treatments   Labs (all labs  ordered are listed, but only abnormal results are displayed) Labs Reviewed  CBC WITH DIFFERENTIAL/PLATELET - Abnormal; Notable for the following components:      Result Value   Hemoglobin 10.6 (*)    HCT 33.7 (*)    MCV 79.9 (*)    MCH 25.1 (*)    All other components within normal limits  COMPREHENSIVE METABOLIC PANEL - Abnormal; Notable for the following components:   Calcium 8.8 (*)    AST  11 (*)    All other components within normal limits  LIPASE, BLOOD - Abnormal; Notable for the following components:   Lipase <10 (*)    All other components within normal limits  PREGNANCY, URINE  TROPONIN I (HIGH SENSITIVITY)    EKG EKG Interpretation  Date/Time:  Saturday July 21 2021 08:17:03 EST Ventricular Rate:  66 PR Interval:  145 QRS Duration: 95 QT Interval:  382 QTC Calculation: 401 R Axis:   45 Text Interpretation: Sinus rhythm Borderline T abnormalities, anterior leads No significant change since last tracing Confirmed by Gareth Morgan [id number]) on 07/21/2021 9:16:31 AM  Radiology DG Chest 2 View  Result Date: 07/21/2021 CLINICAL DATA:  Chest pain for 3 months.  No injury. EXAM: CHEST - 2 VIEW COMPARISON:  05/30/2021. FINDINGS: Normal heart, mediastinum and hila. Clear lungs.  No pleural effusion or pneumothorax. Skeletal structures are within normal limits. IMPRESSION: Normal chest radiographs. Electronically Signed   By: Lajean Manes M.D.   On: 07/21/2021 09:30   CT Head Wo Contrast  Result Date: 07/21/2021 CLINICAL DATA:  Headache for 2 days. History of controlled hypertension. EXAM: CT HEAD WITHOUT CONTRAST TECHNIQUE: Contiguous axial images were obtained from the base of the skull through the vertex without intravenous contrast. RADIATION DOSE REDUCTION: This exam was performed according to the departmental dose-optimization program which includes automated exposure control, adjustment of the mA and/or kV according to patient size and/or use of iterative  reconstruction technique. COMPARISON:  None. FINDINGS: Brain: No evidence of acute infarction, hemorrhage, hydrocephalus, extra-axial collection or mass lesion/mass effect. Vascular: No hyperdense vessel or unexpected calcification. Skull: Normal. Negative for fracture or focal lesion. Sinuses/Orbits: Normal globes and orbits. Visualized sinuses are clear. Other: None. IMPRESSION: Normal unenhanced CT scan of the brain. Electronically Signed   By: Lajean Manes M.D.   On: 07/21/2021 09:29    Procedures Procedures    Medications Ordered in ED Medications  ketorolac (TORADOL) 30 MG/ML injection 30 mg (30 mg Intravenous Given 07/21/21 0959)  cyclobenzaprine (FLEXERIL) tablet 10 mg (10 mg Oral Given 07/21/21 2263)    ED Course/ Medical Decision Making/ A&P                           Medical Decision Making Amount and/or Complexity of Data Reviewed Labs: ordered. Radiology: ordered.  Risk Prescription drug management.    36 year old female with history of elevated blood pressures without official diagnosis of hypertension at this time, hyperlipidemia, anxiety, proteinuria for which she has been seen by nephrology, recent hysteroscopy February 7 with symptoms of chronic dysuria and vaginitis, presents with concern for chest pain.  Differential diagnosis for chest pain includes pulmonary embolus, dissection, pneumothorax, pneumonia, ACS, myocarditis, pericarditis.  EKG was done and evaluate by me and showed no acute ST changes and no signs of pericarditis. Chest x-ray was done and evaluated by me and radiology and showed no sign of pneumonia or pneumothorax. Patient is PERC negative and low risk [place] without dyspnea and have low suspicion for PE.  Patient is low risk HEART score and had troponin 6 hours after symptoms which was personally evaluated and negative.    I personally reviewed and interpreted the other labwork.  Lipase and transaminases not significantly abnormal and doubt pancreatitis,  cholecystitis. Do not feel history or exam are consistent with aortic dissection, normal bilateral pulses, pain worse with palpation.   Patient most likely with musculoskeletal chest pain given pain with palpation, consider other  inflammatory disease, costochondritis.  Given Toradol and flexeril for pain in the ED.  CT head ordered and personally reviewed by me and radiology.  Ordered given her description of headaches over months, visual changes and dizziness over months to screen for large mass or other abnormalities.     Patient discharged in stable condition with understanding of reasons to return and recommend PCP follow up as well as follow up regarding discharge changes since the hyteroscopy a few days ago which are likely post-procedural.           Final Clinical Impression(s) / ED Diagnoses Final diagnoses:  Chest pain, unspecified type    Rx / DC Orders ED Discharge Orders          Ordered    cyclobenzaprine (FLEXERIL) 10 MG tablet  2 times daily PRN        07/21/21 1056    amLODipine (NORVASC) 5 MG tablet  Daily        07/21/21 1056              Gareth Morgan, MD 07/21/21 2316

## 2021-07-25 ENCOUNTER — Ambulatory Visit: Payer: 59

## 2021-07-25 ENCOUNTER — Other Ambulatory Visit: Payer: Self-pay

## 2021-07-25 DIAGNOSIS — M25652 Stiffness of left hip, not elsewhere classified: Secondary | ICD-10-CM

## 2021-07-25 DIAGNOSIS — M25561 Pain in right knee: Secondary | ICD-10-CM | POA: Diagnosis not present

## 2021-07-25 DIAGNOSIS — M25552 Pain in left hip: Secondary | ICD-10-CM
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
## 2021-07-25 NOTE — Patient Instructions (Signed)
Access Code: [email or web address] URL: [email or web address] Date: 07/25/2021 Prepared by: Clarene Essex  Exercises Seated Hamstring Stretch - 2 x daily - 7 x weekly - 2 sets - 2 reps - 30 second hold Supine Quadriceps Stretch with Strap on Table - 2 x daily - 7 x weekly - 2 sets - 2 reps - 30 sec hold Seated Piriformis Stretch - 2 x daily - 7 x weekly - 2 sets - 2 reps - 30 sec hold Active Straight Leg Raise with Quad Set - 1 x daily - 7 x weekly - 3 sets - 10 reps Supine Bridge - 1 x daily - 7 x weekly - 3 sets - 10 reps - 3 second hold Hip Abduction with Resistance Loop - 1 x daily - 7 x weekly - 3 sets - 10 reps Hip Extension with Resistance Loop - 1 x daily - 7 x weekly - 3 sets - 10 reps Forward T - 1 x daily - 7 x weekly - 3 sets - 10 reps Bird Dog on Counter - 1 x daily - 7 x weekly - 3 sets - 10 reps

## 2021-07-31 ENCOUNTER — Other Ambulatory Visit: Payer: Self-pay | Admitting: Pain Medicine

## 2021-07-31 DIAGNOSIS — N644 Mastodynia: Secondary | ICD-10-CM

## 2021-07-31 DIAGNOSIS — N6459 Other signs and symptoms in breast: Secondary | ICD-10-CM

## 2021-08-01 ENCOUNTER — Ambulatory Visit: Payer: 59 | Admitting: Physical Therapy

## 2021-08-02 ENCOUNTER — Other Ambulatory Visit: Payer: Self-pay | Admitting: Family Medicine

## 2021-08-02 ENCOUNTER — Ambulatory Visit: Payer: Self-pay

## 2021-08-02 ENCOUNTER — Ambulatory Visit (INDEPENDENT_AMBULATORY_CARE_PROVIDER_SITE_OTHER): Payer: 59 | Admitting: Family Medicine

## 2021-08-02 ENCOUNTER — Encounter: Payer: Self-pay | Admitting: Family Medicine

## 2021-08-02 VITALS — BP 126/84 | Ht 64.0 in | Wt 256.0 lb

## 2021-08-02 DIAGNOSIS — M357 Hypermobility syndrome: Secondary | ICD-10-CM | POA: Insufficient documentation

## 2021-08-02 DIAGNOSIS — D649 Anemia, unspecified: Secondary | ICD-10-CM | POA: Insufficient documentation

## 2021-08-02 DIAGNOSIS — M25541 Pain in joints of right hand: Secondary | ICD-10-CM | POA: Insufficient documentation

## 2021-08-02 DIAGNOSIS — R0781 Pleurodynia: Secondary | ICD-10-CM | POA: Diagnosis not present

## 2021-08-02 DIAGNOSIS — I951 Orthostatic hypotension: Secondary | ICD-10-CM | POA: Diagnosis not present

## 2021-08-02 DIAGNOSIS — M940 Chondrocostal junction syndrome [Tietze]: Secondary | ICD-10-CM | POA: Insufficient documentation

## 2021-08-02 DIAGNOSIS — M25542 Pain in joints of left hand: Secondary | ICD-10-CM

## 2021-08-02 DIAGNOSIS — D508 Other iron deficiency anemias: Secondary | ICD-10-CM | POA: Diagnosis not present

## 2021-08-02 NOTE — Assessment & Plan Note (Signed)
Acutely occurring.  Previous work-up has been unrevealing.  Seems to have a component of dysautonomia related to her hypermobility. -Counseled on home exercise therapy and supportive care. -Referral to cardiology.

## 2021-08-02 NOTE — Assessment & Plan Note (Signed)
Acutely occurring.  No inflammatory changes appreciated on ultrasound. -Counseled on home exercise therapy and supportive care. -Referral to physical therapy.

## 2021-08-02 NOTE — Assessment & Plan Note (Signed)
Beighton score 9/9.  Likely the source of her symptoms with her previous work-ups being unrevealing in terms of the imaging.

## 2021-08-02 NOTE — Patient Instructions (Signed)
Good to see you Please try physical therapy  I will call with the results from today  Please let me know about your results.  Let me know if you don't hear about the referrals   Please send me a message in MyChart with any questions or updates.  Please see me back in 4-6 weeks.   --Dr. Raeford Razor

## 2021-08-02 NOTE — Assessment & Plan Note (Signed)
Acute on chronic in nature.  Likely having hypermobility playing a role. -Counseled on home exercise therapy and supportive care. -ANA, sed rate, CRP.

## 2021-08-02 NOTE — Assessment & Plan Note (Signed)
Acute on chronic in nature.  Previous ferritin was at 16.  Has tried oral iron with no improvement. -Pathology smear today.

## 2021-08-02 NOTE — Progress Notes (Signed)
Alexis Mccormick - 36 y.o. female MRN [id number]  Date of birth: Sep 27, 1985  SUBJECTIVE:  Including CC & ROS.  No chief complaint on file.   Alexis Mccormick is a 36 y.o. female that is presenting with acute chest pain, bilateral hand pain, intermittent tachycardia.  She has been to cardiology previously that was significantly unrevealing.  She feels palpitations and tachycardia intermittently.  She also has left and right-sided chest pain and sternal pain.  She has pain in her neck as well as her hands.  She has easy bruising and a previous ferritin level of 16.  No improvement with oral iron.   Review of Systems See HPI   HISTORY: Past Medical, Surgical, Social, and Family History Reviewed & Updated per EMR.   Pertinent Historical Findings include:  Past Medical History:  Diagnosis Date   Anemia    Anxiety    Asthma    Carpal tunnel syndrome    right wrist   Chest pain 05/30/2021   ED visit for heart palpitations, chest pain, sob. 05/30/21 chest xray & EKG normal. Troponin levels normal.See ED OV in Epic from 05/30/21.   Chest pain 07/05/2021   Per 07/05/21 ED note from Dr. Laural Golden at [facility] in [place]., chest pain appeared to be musculoskeletal.   COVID 2020   very mild symptoms   Depression    Dizziness 04/2021   Dyspnea 07/05/2021   ED visit for CP & SOB.   Fibroid    Heart palpitations 04/2021   Hypertension    Patient has never been put on BP meds. Cardiology appt 07/19/21 with Dr. Tonia Ghent per pt.   Patellofemoral syndrome of both knees    Plantar fasciitis of right foot    Pre-diabetes    per pt   Wears contact lenses    Wears glasses     Past Surgical History:  Procedure Laterality Date   DILATATION & CURETTAGE/HYSTEROSCOPY WITH MYOSURE N/A 07/17/2021   Procedure: HYSTEROSCOPY WITH MYOSURE POLYPECTOMY;  Surgeon: Carlyon Shadow, MD;  Location: [facility];  Service: Gynecology;  Laterality: N/A;    ESOPHAGOGASTRODUODENOSCOPY  6203   GRAFT APPLICATION  5597   dental bone graft on lower right side   WISDOM TOOTH EXTRACTION  2022     PHYSICAL EXAM:  VS: BP 126/84 (BP Location: Left Arm, Patient Position: Sitting)    Ht 5\' 4"  (1.626 m)    Wt 256 lb (116.1 kg)    LMP 07/10/2021 (Exact Date)    BMI 43.94 kg/m  Physical Exam Gen: NAD, alert, cooperative with exam, well-appearing MSK:  Neurovascularly intact    Limited ultrasound: Chest:  No changes of the left the right sternoclavicular joint. No hyperemia or effusions noted along the sternum. No change in the xiphoid. No changes of the ribs at the mid axillary line  Summary: No structural changes  Ultrasound and interpretation by Clearance Coots, MD    ASSESSMENT & PLAN:   Dysautonomia orthostatic hypotension syndrome Acutely occurring.  Previous work-up has been unrevealing.  Seems to have a component of dysautonomia related to her hypermobility. -Counseled on home exercise therapy and supportive care. -Referral to cardiology.  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  Likely the source of her symptoms with her previous work-ups being unrevealing in terms of the imaging.  Costal margin pain Acutely occurring.  No inflammatory changes appreciated on ultrasound. -Counseled on home exercise therapy and supportive care. -Referral to physical therapy.  Absolute anemia  Acute on chronic in nature.  Previous ferritin was at 16.  Has tried oral iron with no improvement. -Pathology smear today.  Arthralgia of both hands Acute on chronic in nature.  Likely having hypermobility playing a role. -Counseled on home exercise therapy and supportive care. -ANA, sed rate, CRP.

## 2021-08-03 ENCOUNTER — Ambulatory Visit
Admission: RE | Admit: 2021-08-03 | Discharge: 2021-08-03 | Disposition: A | Discharge status: Home / Self Care | Payer: 59 | Source: Ambulatory Visit | Attending: Pain Medicine | Admitting: Pain Medicine

## 2021-08-03 ENCOUNTER — Ambulatory Visit: Payer: 59

## 2021-08-03 ENCOUNTER — Encounter: Payer: Self-pay | Admitting: Family Medicine

## 2021-08-03 ENCOUNTER — Other Ambulatory Visit: Payer: Self-pay

## 2021-08-03 DIAGNOSIS — N644 Mastodynia: Secondary | ICD-10-CM

## 2021-08-03 IMAGING — MG DIGITAL DIAGNOSTIC BILAT W/ TOMO W/ CAD
6 of 12 series · 6 of 36 positions shown · non-contrast
Comparison: None.

CLINICAL DATA: Nonfocal pain on the right. Focal pain in the left
breast.

EXAM:
DIGITAL DIAGNOSTIC BILATERAL MAMMOGRAM WITH TOMOSYNTHESIS AND CAD;
ULTRASOUND LEFT BREAST LIMITED
TECHNIQUE: Bilateral digital diagnostic mammography and breast tomosynthesis
was performed. The images were evaluated with computer-aided
detection.; Targeted ultrasound examination of the left breast was
performed.

[R CC synth-2D]
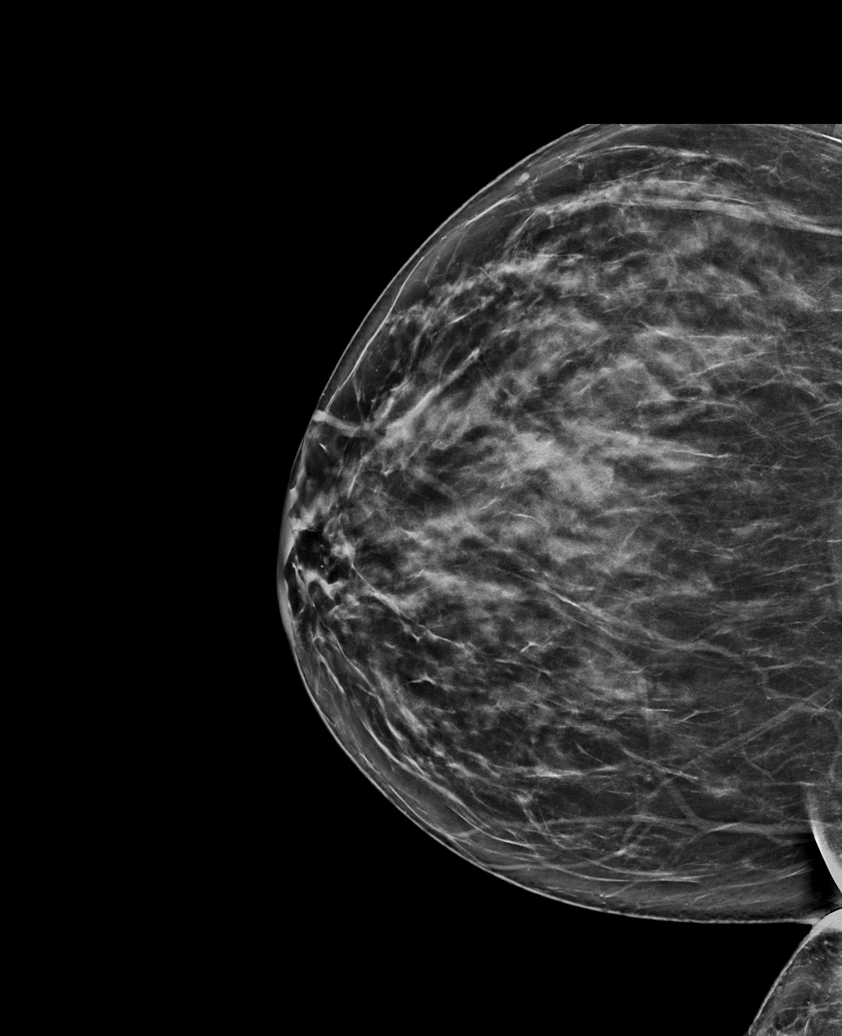

[L CC synth-2D (1 of 2)]
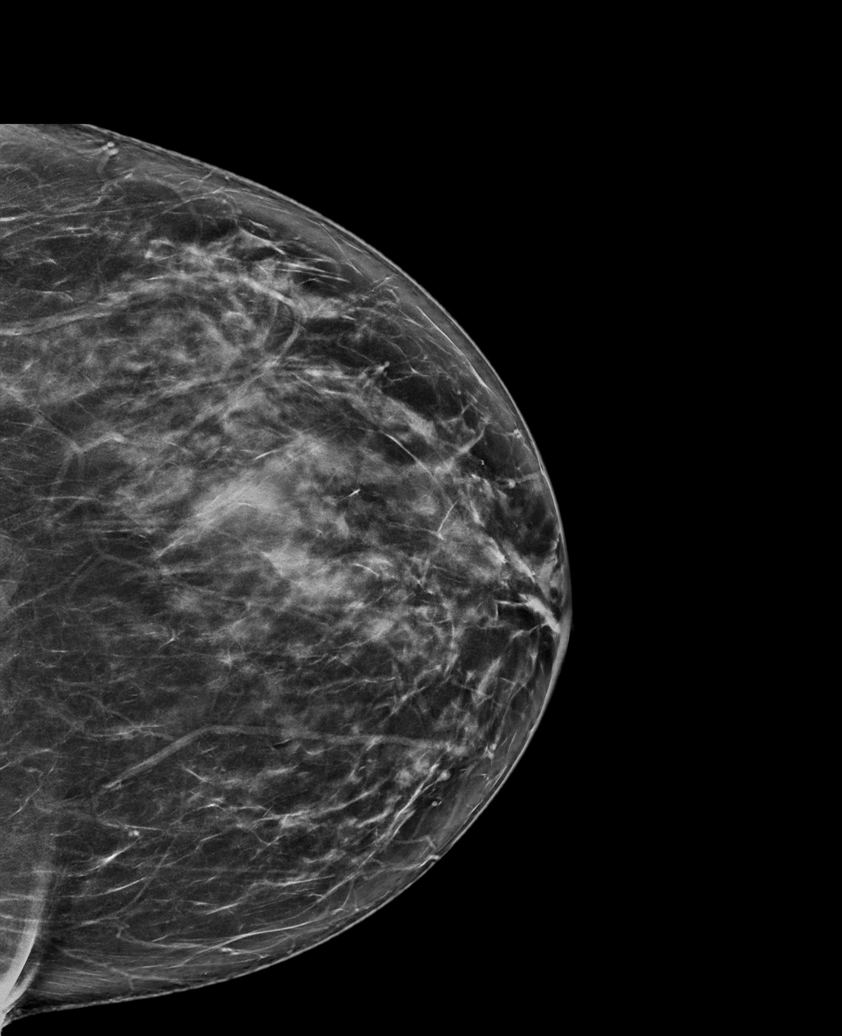

[L CC synth-2D (2 of 2)]
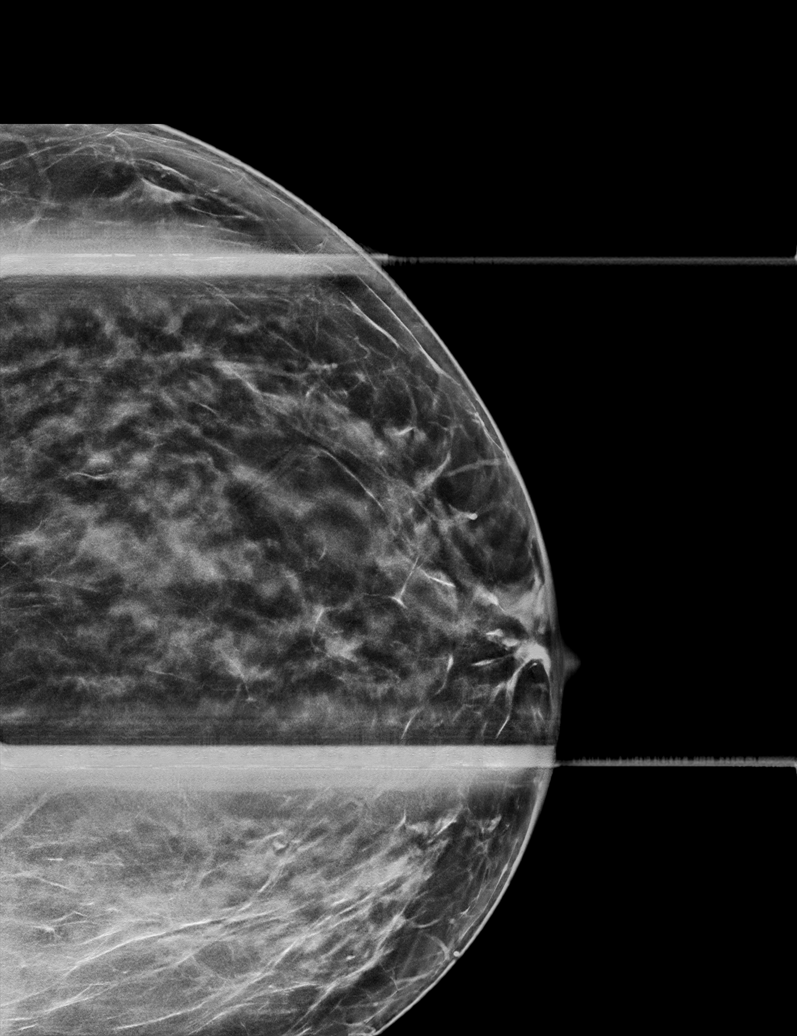

[L TAN synth-2D]
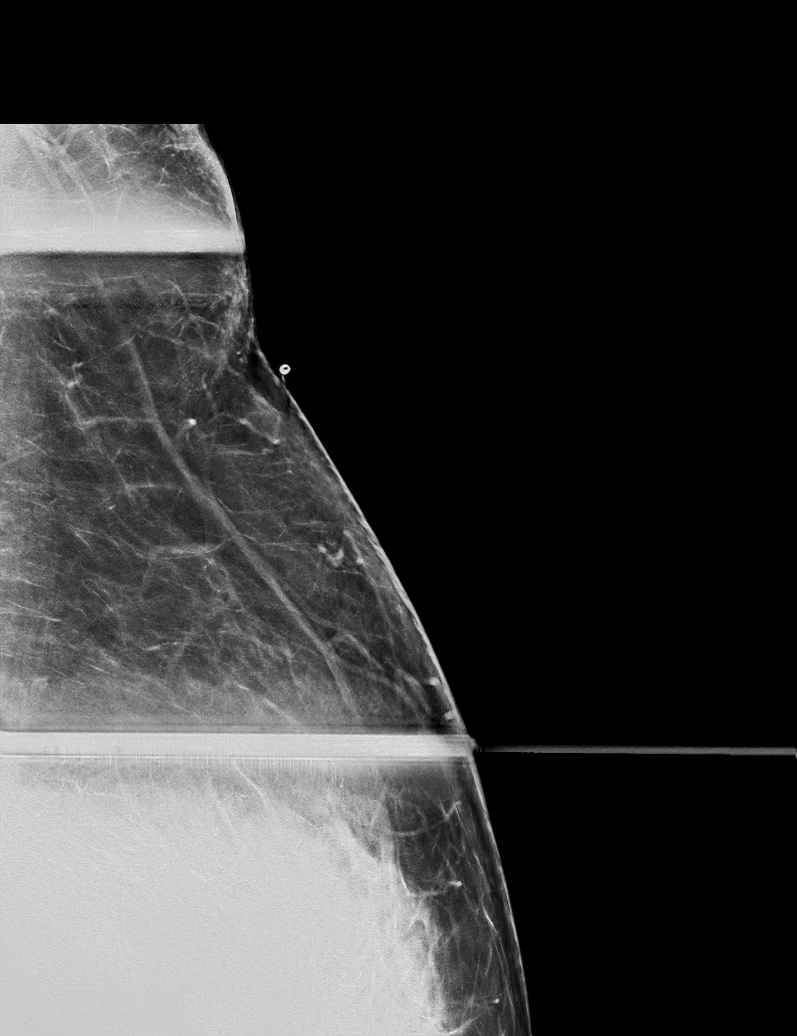

[R MLO synth-2D]
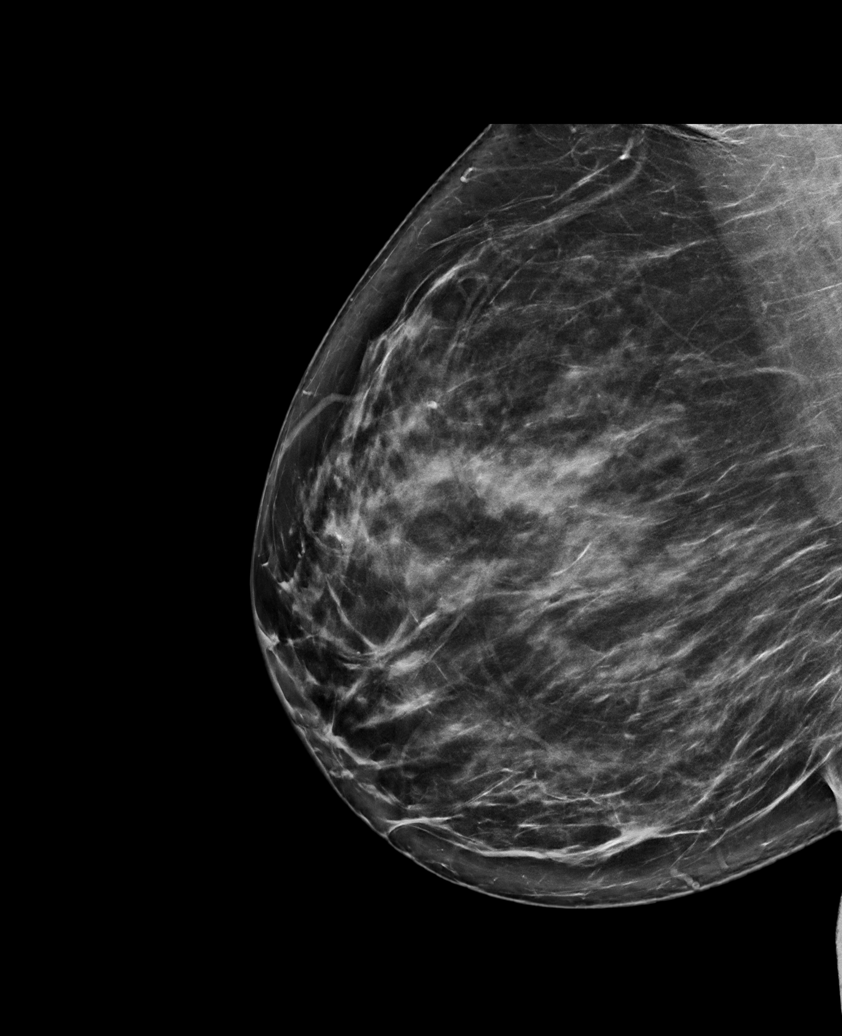

[L MLO synth-2D]
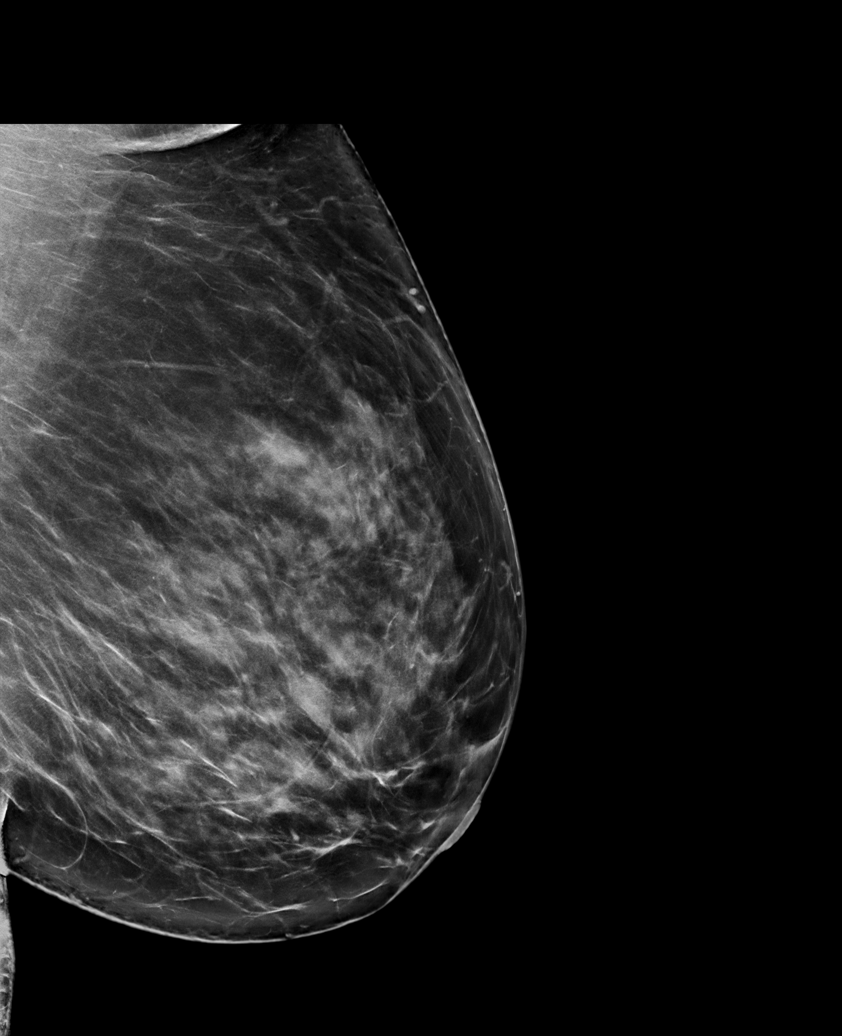

[6 of 36 positions shown; findings below may reference images not displayed]

ACR Breast Density Category c: The breast tissue is heterogeneously
dense, which may obscure small masses.
FINDINGS: No suspicious masses, calcifications, or distortion identified in
either breast. No mammographic evidence of malignancy.

Targeted ultrasound is performed, showing no suspicious findings in
the region of the patient's focal left breast pain.
IMPRESSION: No mammographic or sonographic evidence of malignancy.

RECOMMENDATION:
Treatment of the patient's symptoms should be based on clinical and
physical exam given lack of imaging findings. Recommend annual
screening mammography beginning at the age of 40.

I have discussed the findings and recommendations with the patient.
If applicable, a reminder letter will be sent to the patient
regarding the next appointment.

BI-RADS CATEGORY  1: Negative.

## 2021-08-03 IMAGING — US US BREAST*L* LIMITED INC AXILLA
1 series · 2 of 2 positions shown · non-contrast
Comparison: None.

CLINICAL DATA: Nonfocal pain on the right. Focal pain in the left
breast.

EXAM:
DIGITAL DIAGNOSTIC BILATERAL MAMMOGRAM WITH TOMOSYNTHESIS AND CAD;
ULTRASOUND LEFT BREAST LIMITED
TECHNIQUE: Bilateral digital diagnostic mammography and breast tomosynthesis
was performed. The images were evaluated with computer-aided
detection.; Targeted ultrasound examination of the left breast was
performed.

[Series 1: us breast*left* limited inc axilla · 0.07mm/px · 2 of 2 slices shown]
[im 1/2]
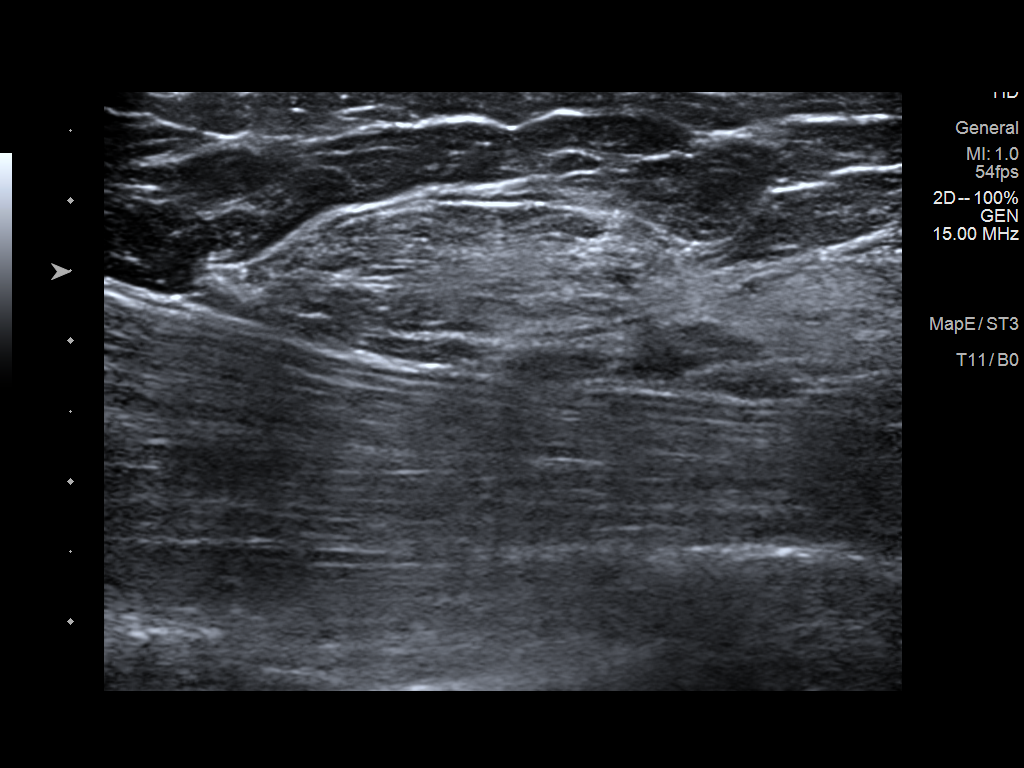
[im 2/2]
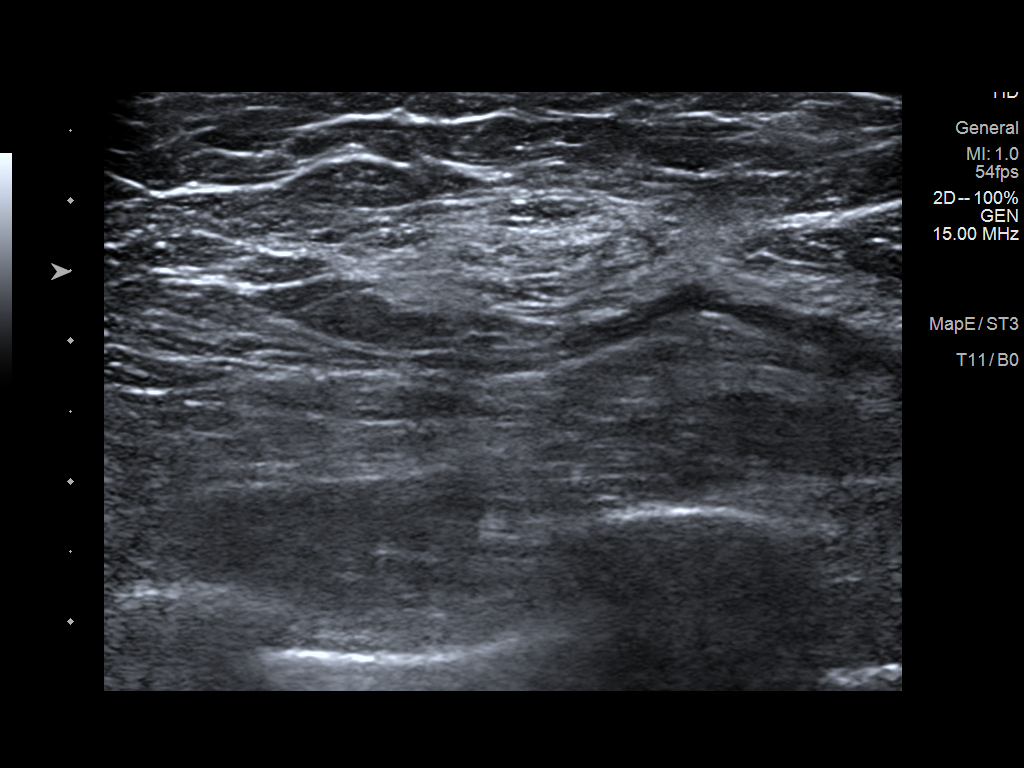

[2 of 2 positions shown; findings below may reference images not displayed]

ACR Breast Density Category c: The breast tissue is heterogeneously
dense, which may obscure small masses.
FINDINGS: No suspicious masses, calcifications, or distortion identified in
either breast. No mammographic evidence of malignancy.

Targeted ultrasound is performed, showing no suspicious findings in
the region of the patient's focal left breast pain.
IMPRESSION: No mammographic or sonographic evidence of malignancy.

RECOMMENDATION:
Treatment of the patient's symptoms should be based on clinical and
physical exam given lack of imaging findings. Recommend annual
screening mammography beginning at the age of 40.

I have discussed the findings and recommendations with the patient.
If applicable, a reminder letter will be sent to the patient
regarding the next appointment.

BI-RADS CATEGORY  1: Negative.

## 2021-08-04 ENCOUNTER — Emergency Department (HOSPITAL_COMMUNITY): Payer: 59

## 2021-08-04 ENCOUNTER — Other Ambulatory Visit: Payer: Self-pay

## 2021-08-04 ENCOUNTER — Encounter (HOSPITAL_COMMUNITY): Payer: Self-pay

## 2021-08-04 ENCOUNTER — Observation Stay (HOSPITAL_COMMUNITY)
Admission: EM | Admit: 2021-08-04 | Discharge: 2021-08-05 | Disposition: A | Discharge status: Home / Self Care | Payer: 59 | Attending: Internal Medicine | Admitting: Internal Medicine

## 2021-08-04 DIAGNOSIS — Z79899 Other long term (current) drug therapy: Secondary | ICD-10-CM | POA: Diagnosis not present

## 2021-08-04 DIAGNOSIS — R030 Elevated blood-pressure reading, without diagnosis of hypertension: Secondary | ICD-10-CM

## 2021-08-04 DIAGNOSIS — R230 Cyanosis: Secondary | ICD-10-CM | POA: Diagnosis not present

## 2021-08-04 DIAGNOSIS — Z8616 Personal history of COVID-19: Secondary | ICD-10-CM | POA: Diagnosis not present

## 2021-08-04 DIAGNOSIS — I160 Hypertensive urgency: Secondary | ICD-10-CM | POA: Diagnosis not present

## 2021-08-04 DIAGNOSIS — R42 Dizziness and giddiness: Secondary | ICD-10-CM

## 2021-08-04 DIAGNOSIS — J45909 Unspecified asthma, uncomplicated: Secondary | ICD-10-CM | POA: Diagnosis not present

## 2021-08-04 DIAGNOSIS — Z20822 Contact with and (suspected) exposure to covid-19: Secondary | ICD-10-CM | POA: Diagnosis not present

## 2021-08-04 DIAGNOSIS — I10 Essential (primary) hypertension: Secondary | ICD-10-CM | POA: Insufficient documentation

## 2021-08-04 DIAGNOSIS — R079 Chest pain, unspecified: Secondary | ICD-10-CM | POA: Diagnosis not present

## 2021-08-04 LAB — COMPREHENSIVE METABOLIC PANEL
ALT: 16 U/L (ref 0–44)
AST: 18 U/L (ref 15–41)
Albumin: 3.9 g/dL (ref 3.5–5.0)
Alkaline Phosphatase: 48 U/L (ref 38–126)
Anion gap: 6 (ref 5–15)
BUN: 11 mg/dL (ref 6–20)
CO2: 25 mmol/L (ref 22–32)
Calcium: 9 mg/dL (ref 8.9–10.3)
Chloride: 106 mmol/L (ref 98–111)
Creatinine, Ser: 0.75 mg/dL (ref 0.44–1.00)
GFR, Estimated: >60 mL/min (ref >60)
Glucose, Bld: 87 mg/dL (ref 70–99)
Potassium: 3.2 mmol/L — ABNORMAL LOW (ref 3.5–5.1)
Sodium: 137 mmol/L (ref 135–145)
Total Bilirubin: 0.2 mg/dL — ABNORMAL LOW (ref 0.3–1.2)
Total Protein: 7.9 g/dL (ref 6.5–8.1)

## 2021-08-04 LAB — CBC WITH DIFFERENTIAL/PLATELET
Abs Immature Granulocytes: 0.02 10*3/uL (ref 0.00–0.07)
Basophils Absolute: 0 10*3/uL (ref 0.0–0.1)
Basophils Relative: 0 %
Eosinophils Absolute: 0 10*3/uL (ref 0.0–0.5)
Eosinophils Relative: 0 %
HCT: 36.5 % (ref 36.0–46.0)
Hemoglobin: 11.3 g/dL — ABNORMAL LOW (ref 12.0–15.0)
Immature Granulocytes: 0 %
Lymphocytes Relative: 27 %
Lymphs Abs: 2 10*3/uL (ref 0.7–4.0)
MCH: 25.2 pg — ABNORMAL LOW (ref 26.0–34.0)
MCHC: 31 g/dL (ref 30.0–36.0)
MCV: 81.3 fL (ref 80.0–100.0)
Monocytes Absolute: 0.5 10*3/uL (ref 0.1–1.0)
Monocytes Relative: 6 %
Neutro Abs: 4.8 10*3/uL (ref 1.7–7.7)
Neutrophils Relative %: 67 %
Platelets: 343 10*3/uL (ref 150–400)
RBC: 4.49 MIL/uL (ref 3.87–5.11)
RDW: 16.2 % — ABNORMAL HIGH (ref 11.5–15.5)
WBC: 7.4 10*3/uL (ref 4.0–10.5)
nRBC: 0 % (ref 0.0–0.2)

## 2021-08-04 LAB — TROPONIN I (HIGH SENSITIVITY)
Troponin I (High Sensitivity): <2 ng/L (ref <18)
Troponin I (High Sensitivity): 3 ng/L (ref <18)

## 2021-08-04 LAB — I-STAT BETA HCG BLOOD, ED (MC, WL, AP ONLY): I-stat hCG, quantitative: <5 m[IU]/mL (ref <5)

## 2021-08-04 LAB — LACTIC ACID, PLASMA
Lactic Acid, Venous: 1.1 mmol/L (ref 0.5–1.9)
Lactic Acid, Venous: 0.8 mmol/L (ref 0.5–1.9)

## 2021-08-04 LAB — PROTIME-INR
INR: 1.1 (ref 0.8–1.2)
Prothrombin Time: 14.6 seconds (ref 11.4–15.2)

## 2021-08-04 LAB — RESP PANEL BY RT-PCR (FLU A&B, COVID) ARPGX2
Influenza A by PCR: NEGATIVE
Influenza B by PCR: NEGATIVE
SARS Coronavirus 2 by RT PCR: NEGATIVE

## 2021-08-04 LAB — D-DIMER, QUANTITATIVE: D-Dimer, Quant: <0.27 ug/mL-FEU (ref 0.00–0.50)

## 2021-08-04 LAB — BRAIN NATRIURETIC PEPTIDE: B Natriuretic Peptide: 18.1 pg/mL (ref 0.0–100.0)

## 2021-08-04 LAB — CK: Total CK: 99 U/L (ref 38–234)

## 2021-08-04 LAB — AMMONIA: Ammonia: 19 umol/L (ref 9–35)

## 2021-08-04 LAB — LIPASE, BLOOD: Lipase: 24 U/L (ref 11–51)

## 2021-08-04 LAB — MAGNESIUM: Magnesium: 1.9 mg/dL (ref 1.7–2.4)

## 2021-08-04 IMAGING — CR DG HIP (WITH OR WITHOUT PELVIS) 2-3V*L*
3 series · 3 of 3 positions shown · non-contrast
Comparison: [DATE]

CLINICAL DATA: Left hip pain for 2 days.  No known injury.

EXAM:
DG HIP (WITH OR WITHOUT PELVIS) 2-3V LEFT

[t pelvis ap]
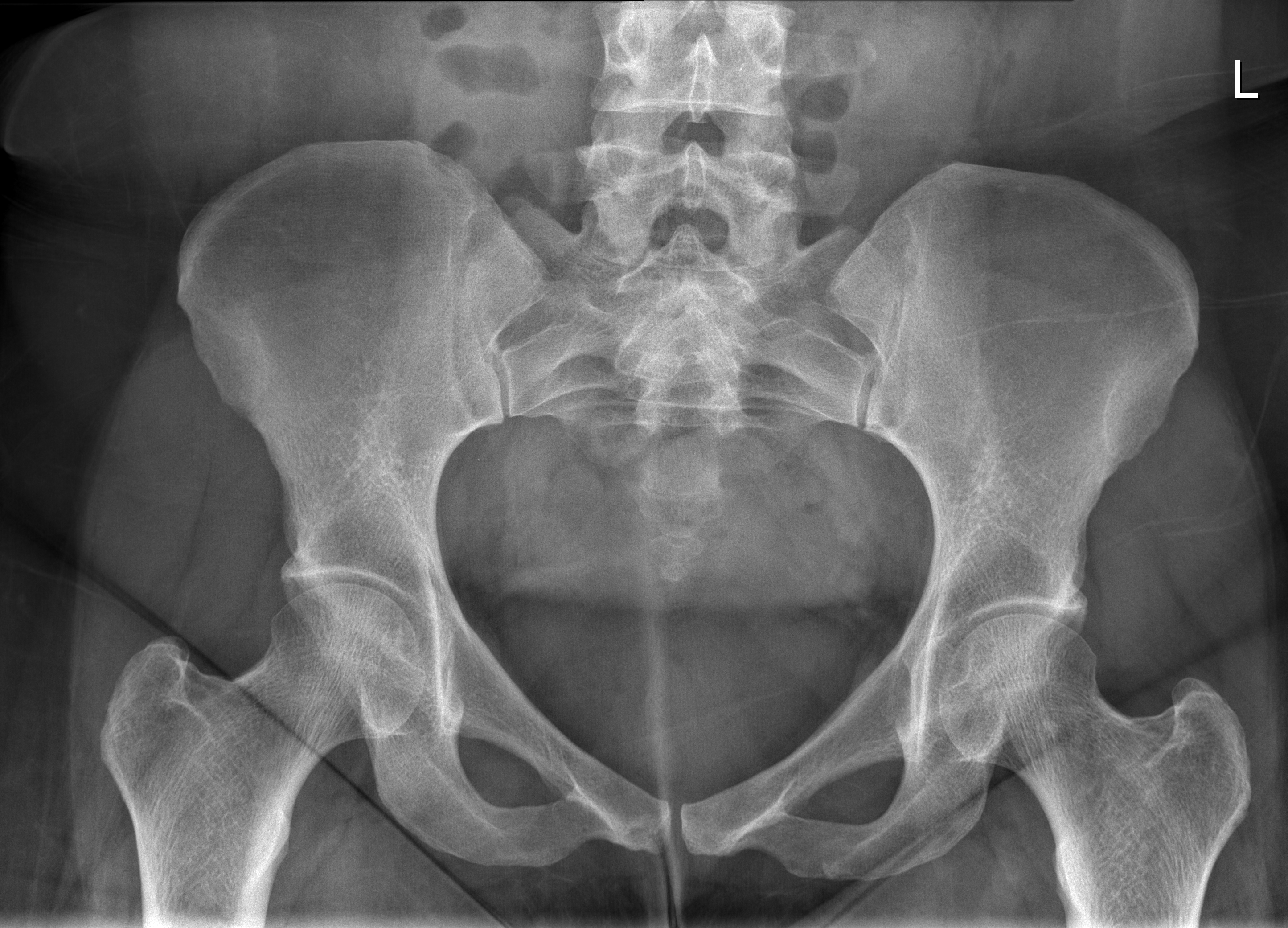

[t hip ap left]
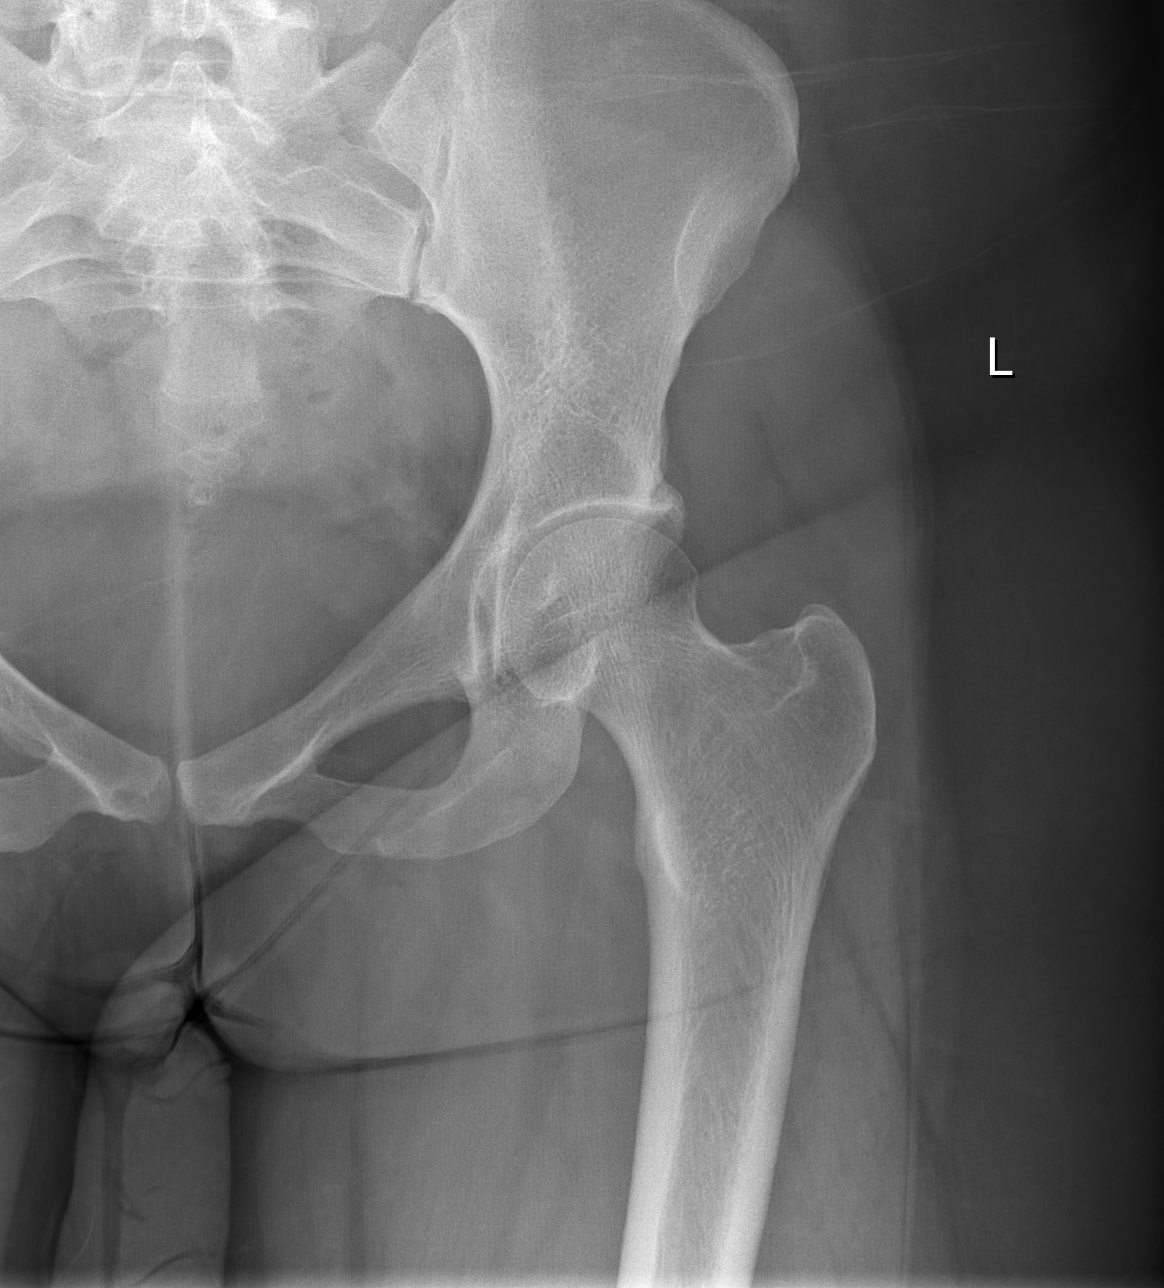

[t hip frog leg left]
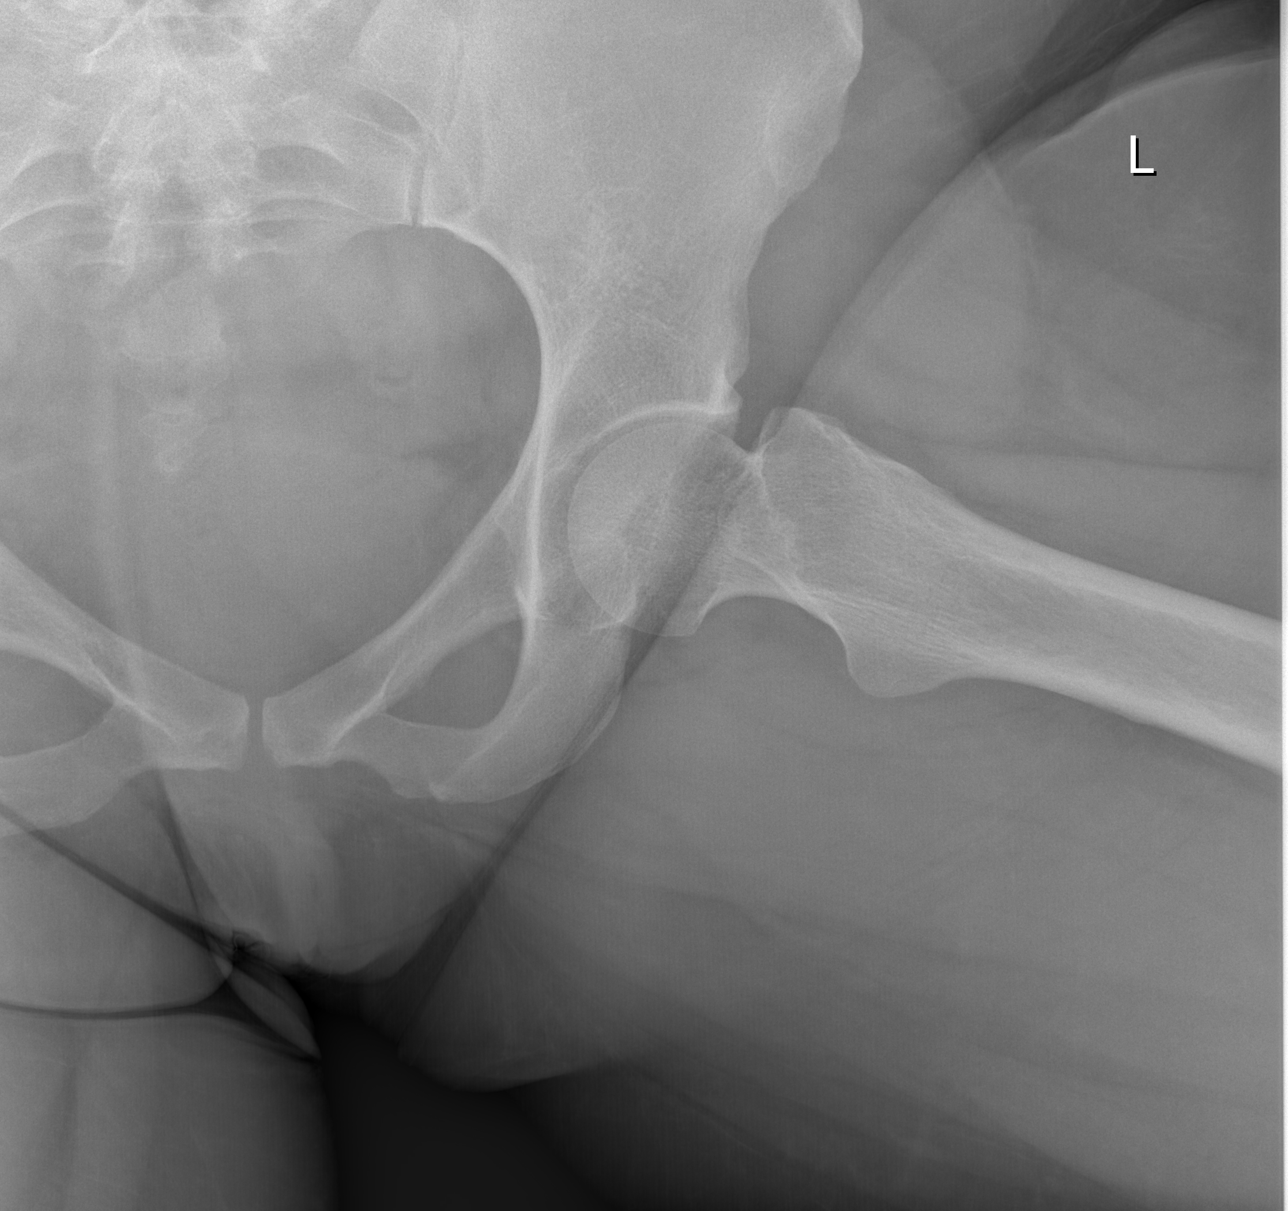

[3 of 3 positions shown; findings below may reference images not displayed]

FINDINGS: There is no evidence of hip fracture or dislocation. There is no
evidence of arthropathy or other focal bone abnormality.
IMPRESSION: Negative.

## 2021-08-04 IMAGING — MR MR HEAD WO/W CM
13 series · 48 of 48 positions shown · IV contrast (gadavist)
Comparison: None.

CLINICAL DATA: Vision loss, monocular; Vision loss, monocular right
decreased vision

EXAM:
MRI HEAD AND ORBITS WITHOUT AND WITH CONTRAST
TECHNIQUE: Multiplanar, multiecho pulse sequences of the brain and surrounding
structures were obtained without and with intravenous contrast.
Multiplanar, multiecho pulse sequences of the orbits and surrounding
structures were obtained including fat saturation techniques, before
and after intravenous contrast administration.
CONTRAST:  10mL GADAVIST GADOBUTROL 1 MMOL/ML IV SOLN

[Series 5: DWI · axial · 3.0mm · 1.36mm/px · z∈[-78,+73]mm · 7 of 104 slices shown (1 of 2)]
[im 1/104]
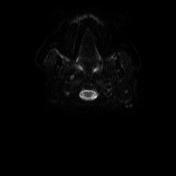
[im 18/104]
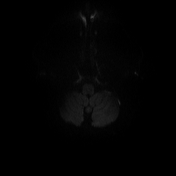
[im 35/104]
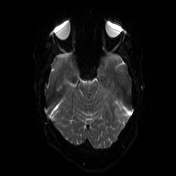
[im 52/104]
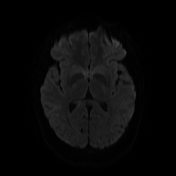
[im 69/104]
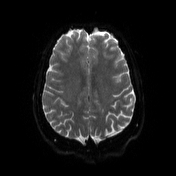
[im 86/104]
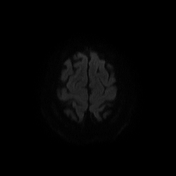
[im 104/104]
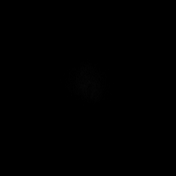

[Series 6: DWI · axial · 3.0mm · 1.36mm/px · z∈[-78,+73]mm · 3 of 52 slices shown (2 of 2)]
[im 1/52]
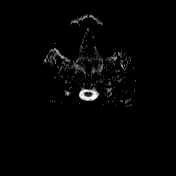
[im 26/52]
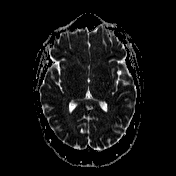
[im 52/52]
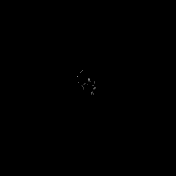

[Series 7: T1 · sagittal · 5.0mm · 0.75mm/px · 1 of 25 slices shown (1 of 2)]
[im 1/25]
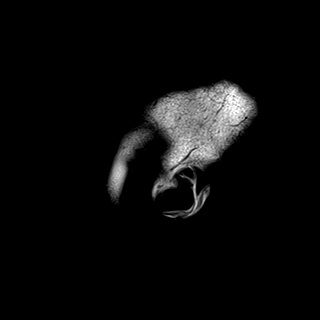

[Series 8: T2 · axial · 5.0mm · 0.62mm/px · 1 of 25 slices shown]
[im 1/25]
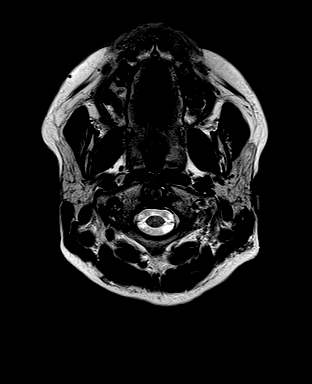

[Series 9: swi_images · axial · 3.0mm · 0.75mm/px · z∈[-74,+78]mm · 3 of 52 slices shown]
[im 1/52]
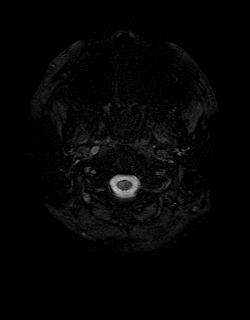
[im 26/52]
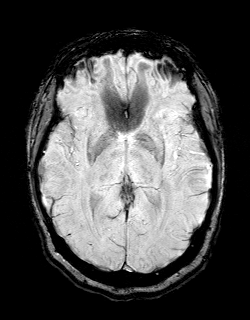
[im 52/52]
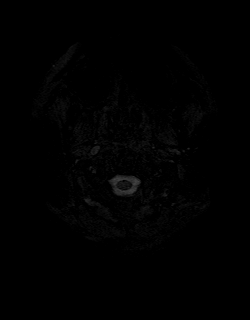

[Series 11: FLAIR · axial · 3.0mm · 0.75mm/px · z∈[-74,+78]mm · 3 of 52 slices shown]
[im 1/52]
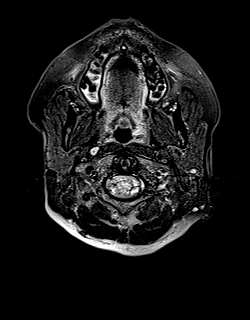
[im 26/52]
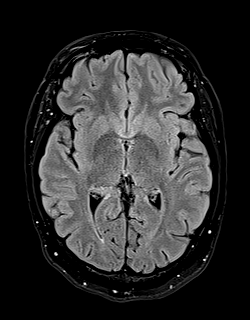
[im 52/52]
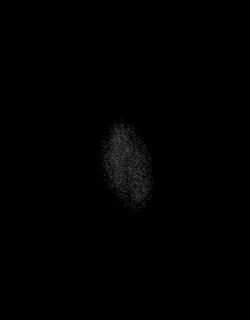

[Series 12: T1 · axial · 1.0mm · 0.94mm/px · z∈[-77,+81]mm · 10 of 160 slices shown (2 of 2)]
[im 1/160]
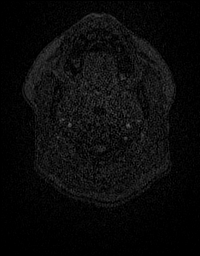
[im 18/160]
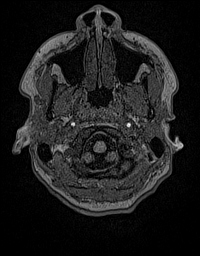
[im 36/160]
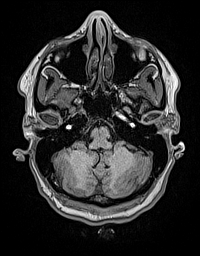
[im 54/160]
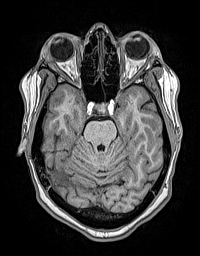
[im 71/160]
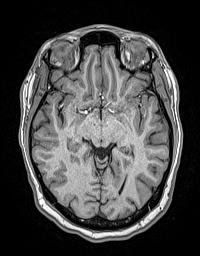
[im 89/160]
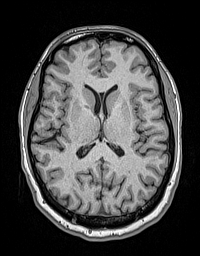
[im 107/160]
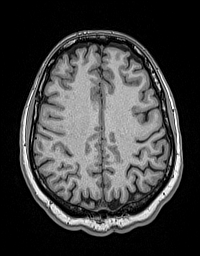
[im 124/160]
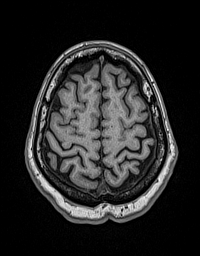
[im 142/160]
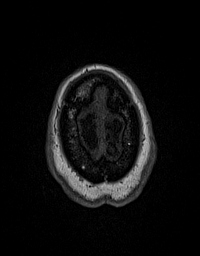
[im 160/160]
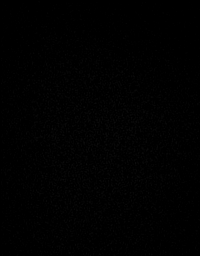

[Series 13: cor dwi_tracew · coronal · 5.0mm · 1.53mm/px · 3 of 58 slices shown]
[im 1/58]
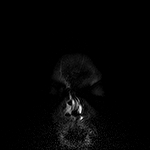
[im 29/58]
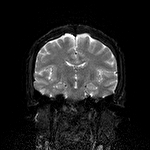
[im 58/58]
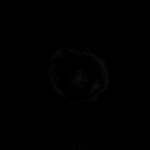

[Series 14: cor dwi_adc · coronal · 5.0mm · 1.53mm/px · 2 of 29 slices shown]
[im 1/29]
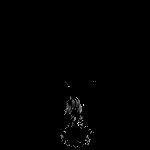
[im 29/29]
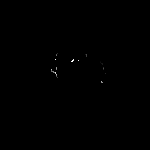

[Series 19: T2 post-contrast · coronal · 5.0mm · 0.57mm/px · 2 of 31 slices shown]
[im 1/31]
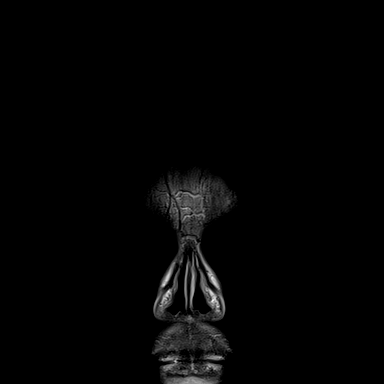
[im 31/31]
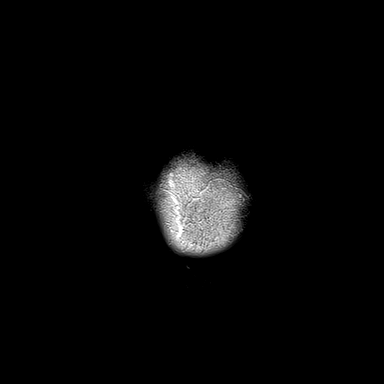

[Series 20: T1 post-contrast · axial · 1.0mm · 0.94mm/px · z∈[-115,+43]mm · 10 of 160 slices shown (1 of 3)]
[im 1/160]
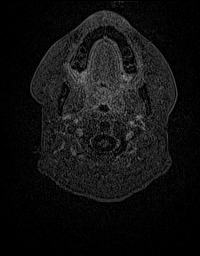
[im 18/160]
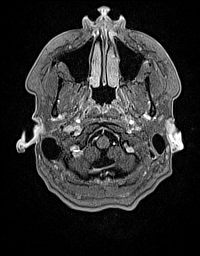
[im 36/160]
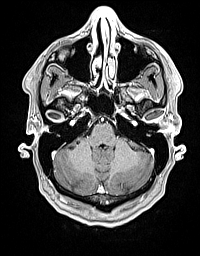
[im 54/160]
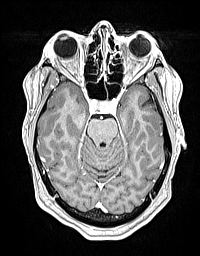
[im 71/160]
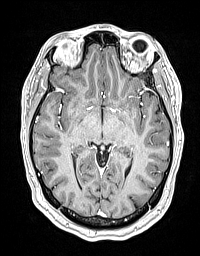
[im 89/160]
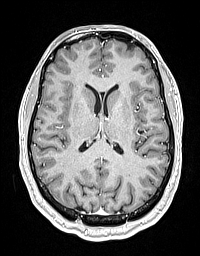
[im 107/160]
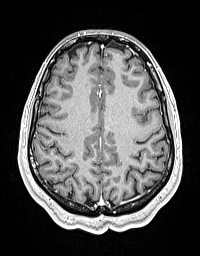
[im 124/160]
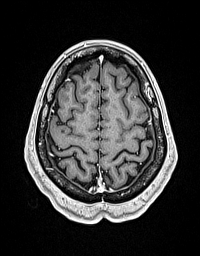
[im 142/160]
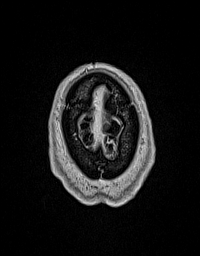
[im 160/160]
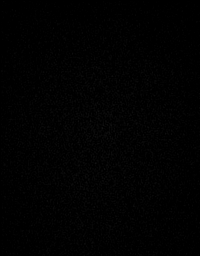

[Series 21: T1 post-contrast · coronal · 5.0mm · 0.43mm/px · 2 of 31 slices shown (2 of 3)]
[im 1/31]
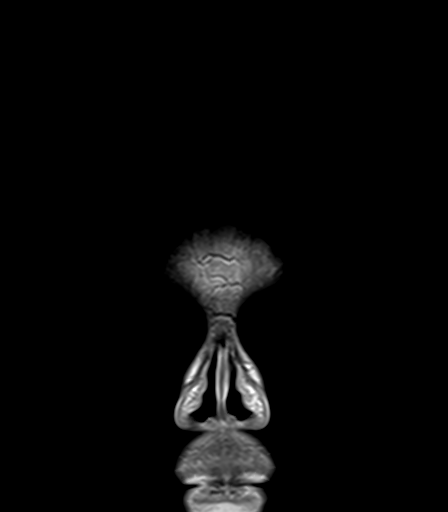
[im 31/31]
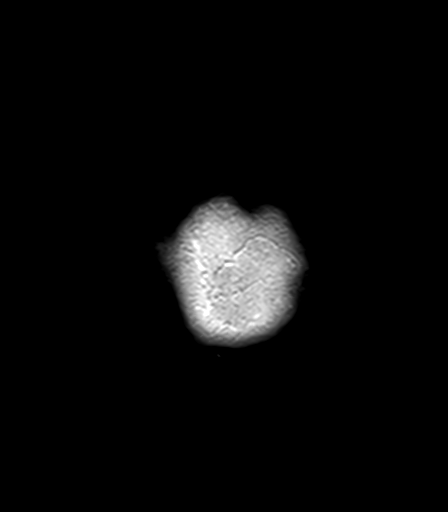

[Series 22: T1 post-contrast · sagittal · 5.0mm · 0.75mm/px · 1 of 25 slices shown (3 of 3)]
[im 1/25]
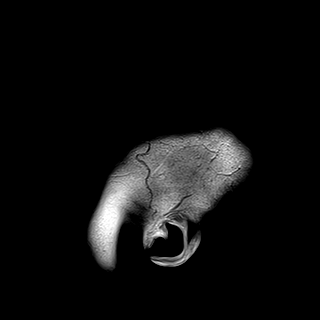

[48 of 48 positions shown; findings below may reference images not displayed]

FINDINGS: MRI HEAD FINDINGS

Brain: No acute infarction or hemorrhage. Ventricles and sulci are
normal in size and configuration. There is no intracranial mass,
mass effect, edema, hydrocephalus, or extra-axial collection. Few
scattered small foci T2 hyperintensity in the supratentorial
primarily subcortical white matter. There is also involvement of the
right internal capsule. No abnormal enhancement.

Vascular: Major vessel flow voids at the skull base are preserved.

Skull and upper cervical spine: Marrow signal is within normal
limits.

Other: Sella is unremarkable.  Mastoid air cells are clear.

MRI ORBITS FINDINGS

Orbits: No proptosis. No intraorbital mass. Globes, lacrimal glands,
and extraocular muscles are symmetric and unremarkable. There is no
abnormal enhancement of the optic nerve sheath complexes.

Visualized sinuses: No significant opacification.

Soft tissues: Unremarkable.
IMPRESSION: No evidence of recent infarction, hemorrhage, or mass. No abnormal
enhancement.

Mild burden of T2 hyperintense foci in the cerebral white matter
likely reflecting nonspecific gliosis/demyelination.

## 2021-08-04 IMAGING — MR MR ORBITS WO/W CM
7 series · 42 of 48 positions shown · IV contrast (gadavist)
Comparison: None.

CLINICAL DATA: Vision loss, monocular; Vision loss, monocular right
decreased vision

EXAM:
MRI HEAD AND ORBITS WITHOUT AND WITH CONTRAST
TECHNIQUE: Multiplanar, multiecho pulse sequences of the brain and surrounding
structures were obtained without and with intravenous contrast.
Multiplanar, multiecho pulse sequences of the orbits and surrounding
structures were obtained including fat saturation techniques, before
and after intravenous contrast administration.
CONTRAST:  10mL GADAVIST GADOBUTROL 1 MMOL/ML IV SOLN

[Series 2: T1 · sagittal · 5.0mm · 0.75mm/px · 6 of 25 slices shown (1 of 3)]
[im 1/25]
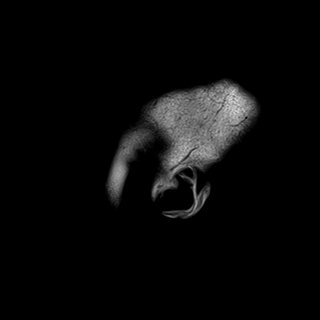
[im 5/25]
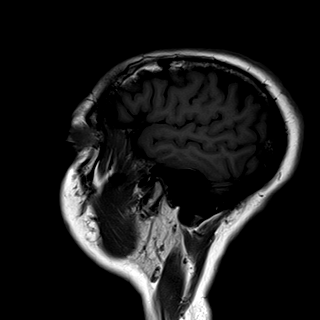
[im 10/25]
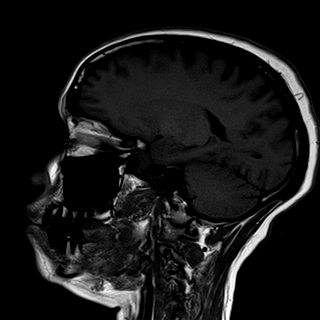
[im 15/25]
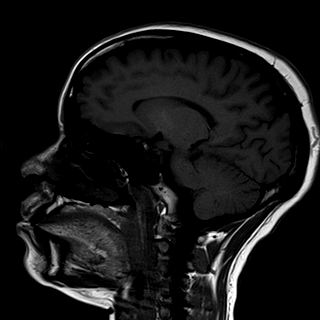
[im 20/25]
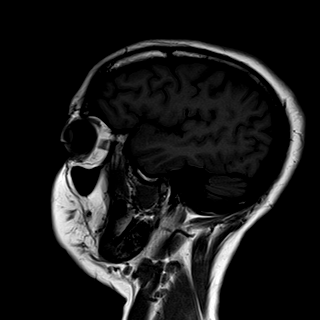
[im 25/25]
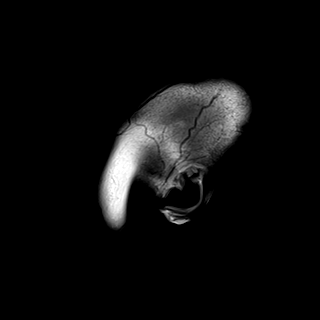

[Series 3: T2 fat-sat · axial · 3.0mm · 0.47mm/px · z∈[-56,+6]mm · 5 of 20 slices shown (1 of 2)]
[im 1/20]
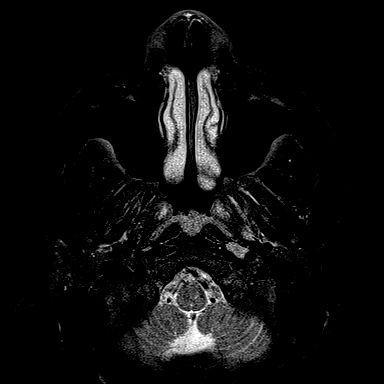
[im 5/20]
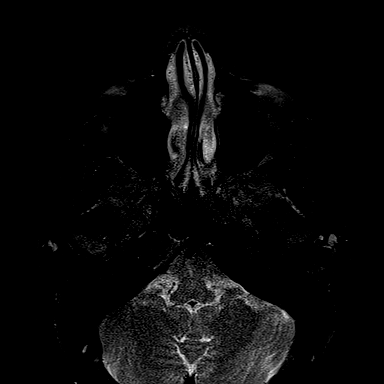
[im 10/20]
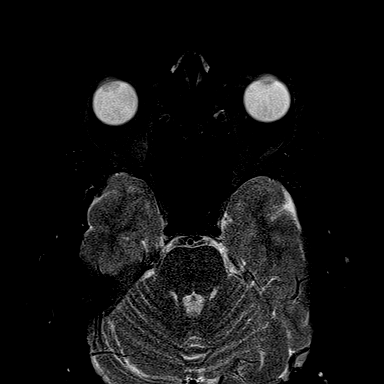
[im 15/20]
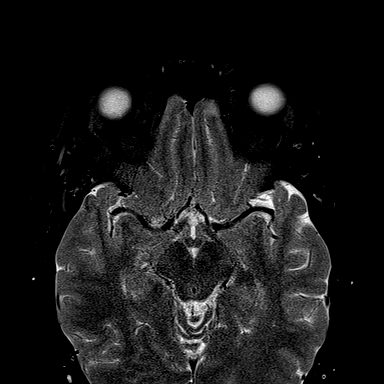
[im 20/20]
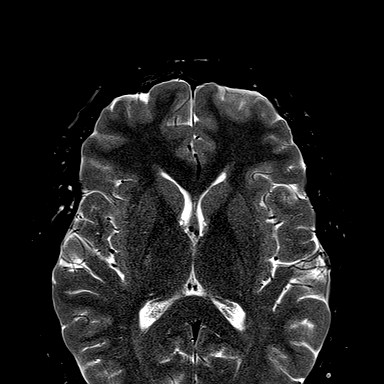

[Series 4: T1 · axial · 3.0mm · 0.56mm/px · z∈[-56,+6]mm · 5 of 20 slices shown (2 of 3)]
[im 1/20]
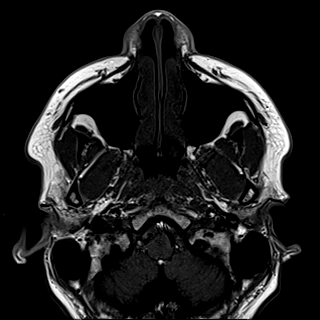
[im 5/20]
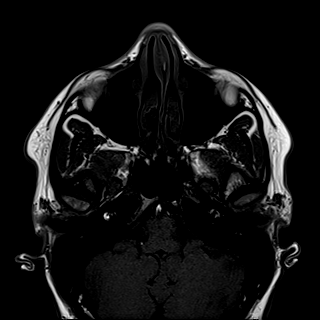
[im 10/20]
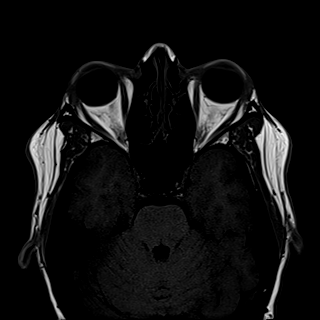
[im 15/20]
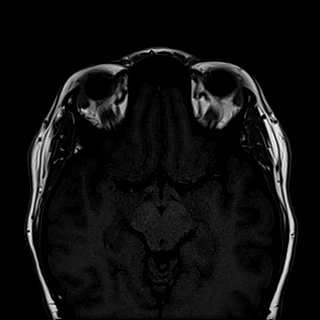
[im 20/20]
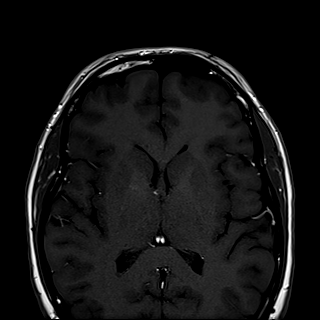

[Series 5: T2 fat-sat · coronal · 3.0mm · 0.44mm/px · 9 of 34 slices shown (2 of 2)]
[im 1/34]
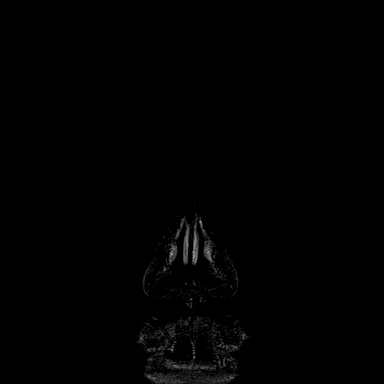
[im 5/34]
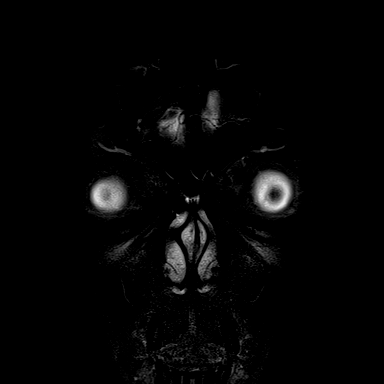
[im 9/34]
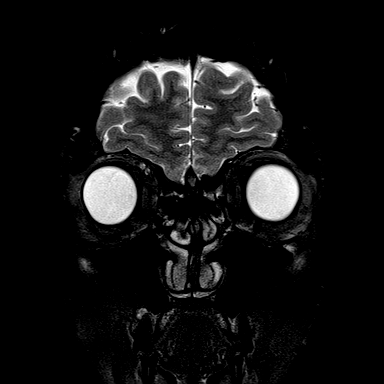
[im 13/34]
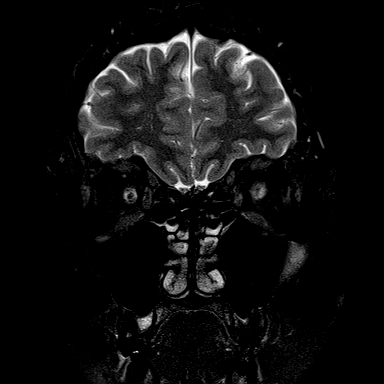
[im 17/34]
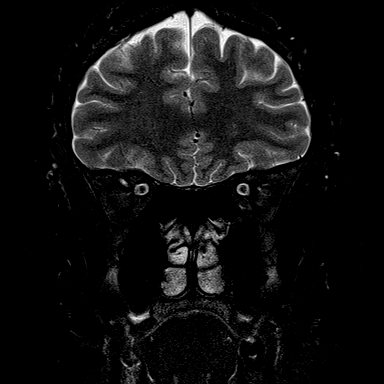
[im 21/34]
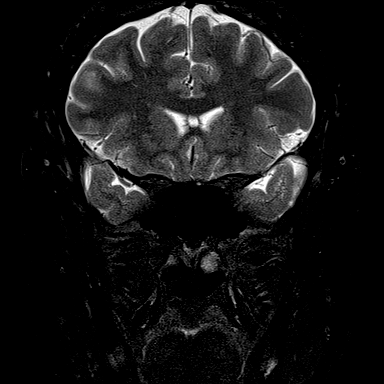
[im 25/34]
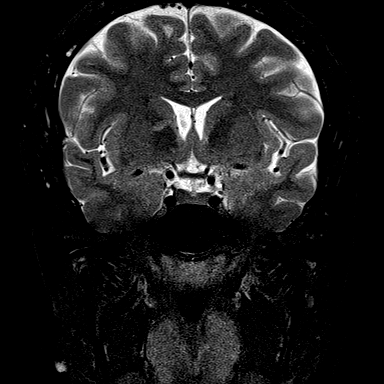
[im 29/34]
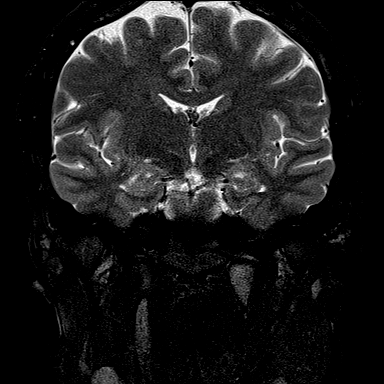
[im 34/34]
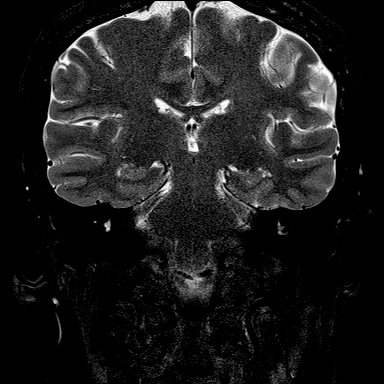

[Series 6: T1 · coronal · 3.0mm · 0.56mm/px · 8 of 34 slices shown (3 of 3)]
[im 1/34]
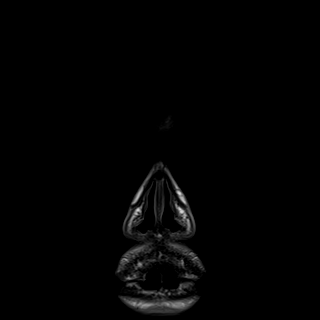
[im 5/34]
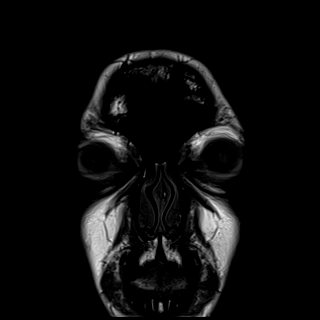
[im 9/34]
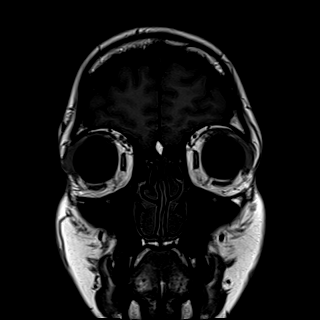
[im 13/34]
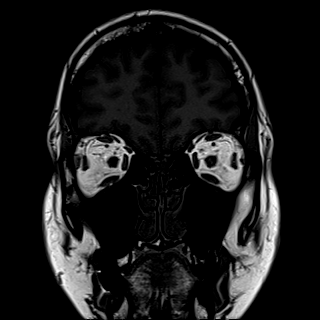
[im 21/34]
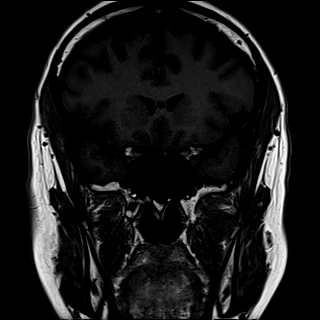
[im 25/34]
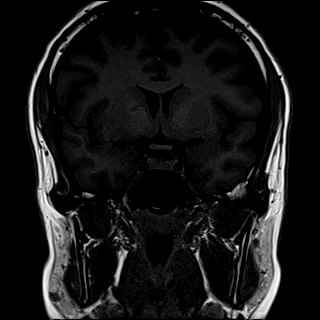
[im 29/34]
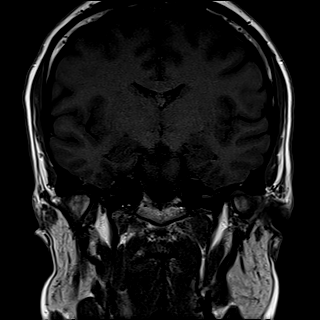
[im 34/34]
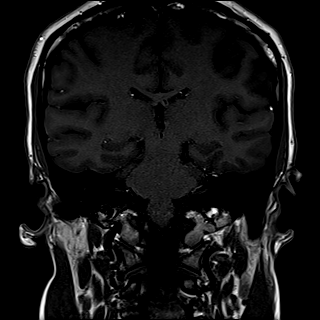

[Series 7: T1 fat-sat post-contrast · coronal · 3.0mm · 0.70mm/px · 8 of 34 slices shown (1 of 2)]
[im 1/34]
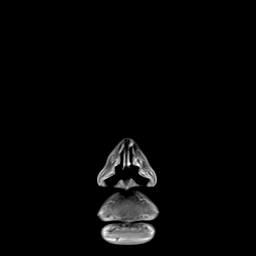
[im 5/34]
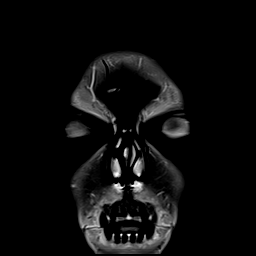
[im 9/34]
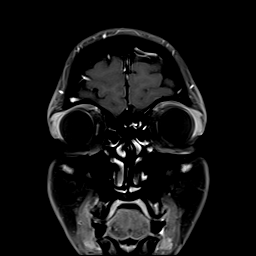
[im 13/34]
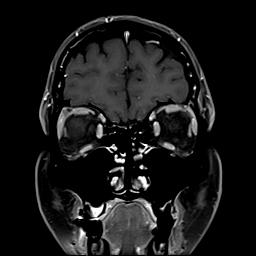
[im 21/34]
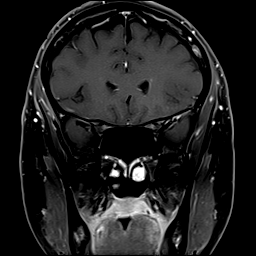
[im 25/34]
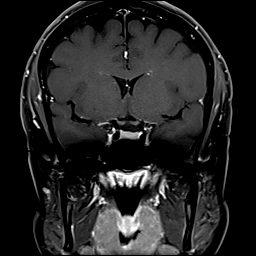
[im 29/34]
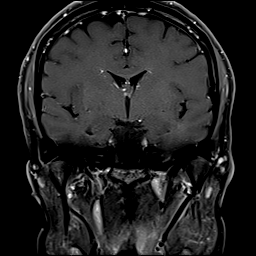
[im 34/34]
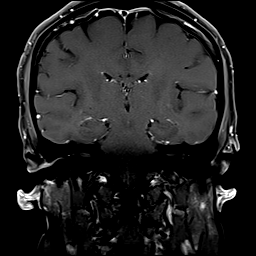

[Series 8: T1 fat-sat post-contrast · axial · 3.0mm · 0.56mm/px · 1 of 20 slices shown (2 of 2)]
[im 1/20]
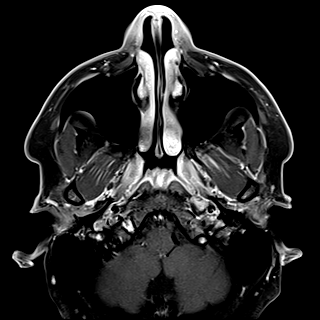

[42 of 48 positions shown; findings below may reference images not displayed]

FINDINGS: MRI HEAD FINDINGS

Brain: No acute infarction or hemorrhage. Ventricles and sulci are
normal in size and configuration. There is no intracranial mass,
mass effect, edema, hydrocephalus, or extra-axial collection. Few
scattered small foci T2 hyperintensity in the supratentorial
primarily subcortical white matter. There is also involvement of the
right internal capsule. No abnormal enhancement.

Vascular: Major vessel flow voids at the skull base are preserved.

Skull and upper cervical spine: Marrow signal is within normal
limits.

Other: Sella is unremarkable.  Mastoid air cells are clear.

MRI ORBITS FINDINGS

Orbits: No proptosis. No intraorbital mass. Globes, lacrimal glands,
and extraocular muscles are symmetric and unremarkable. There is no
abnormal enhancement of the optic nerve sheath complexes.

Visualized sinuses: No significant opacification.

Soft tissues: Unremarkable.
IMPRESSION: No evidence of recent infarction, hemorrhage, or mass. No abnormal
enhancement.

Mild burden of T2 hyperintense foci in the cerebral white matter
likely reflecting nonspecific gliosis/demyelination.

## 2021-08-04 IMAGING — CR DG CHEST 2V
2 series · 2 of 2 positions shown · non-contrast
Comparison: [DATE]

CLINICAL DATA: Left chest pain and dizziness. Asthma. Hypertension.

EXAM:
CHEST - 2 VIEW

[w chest pa]
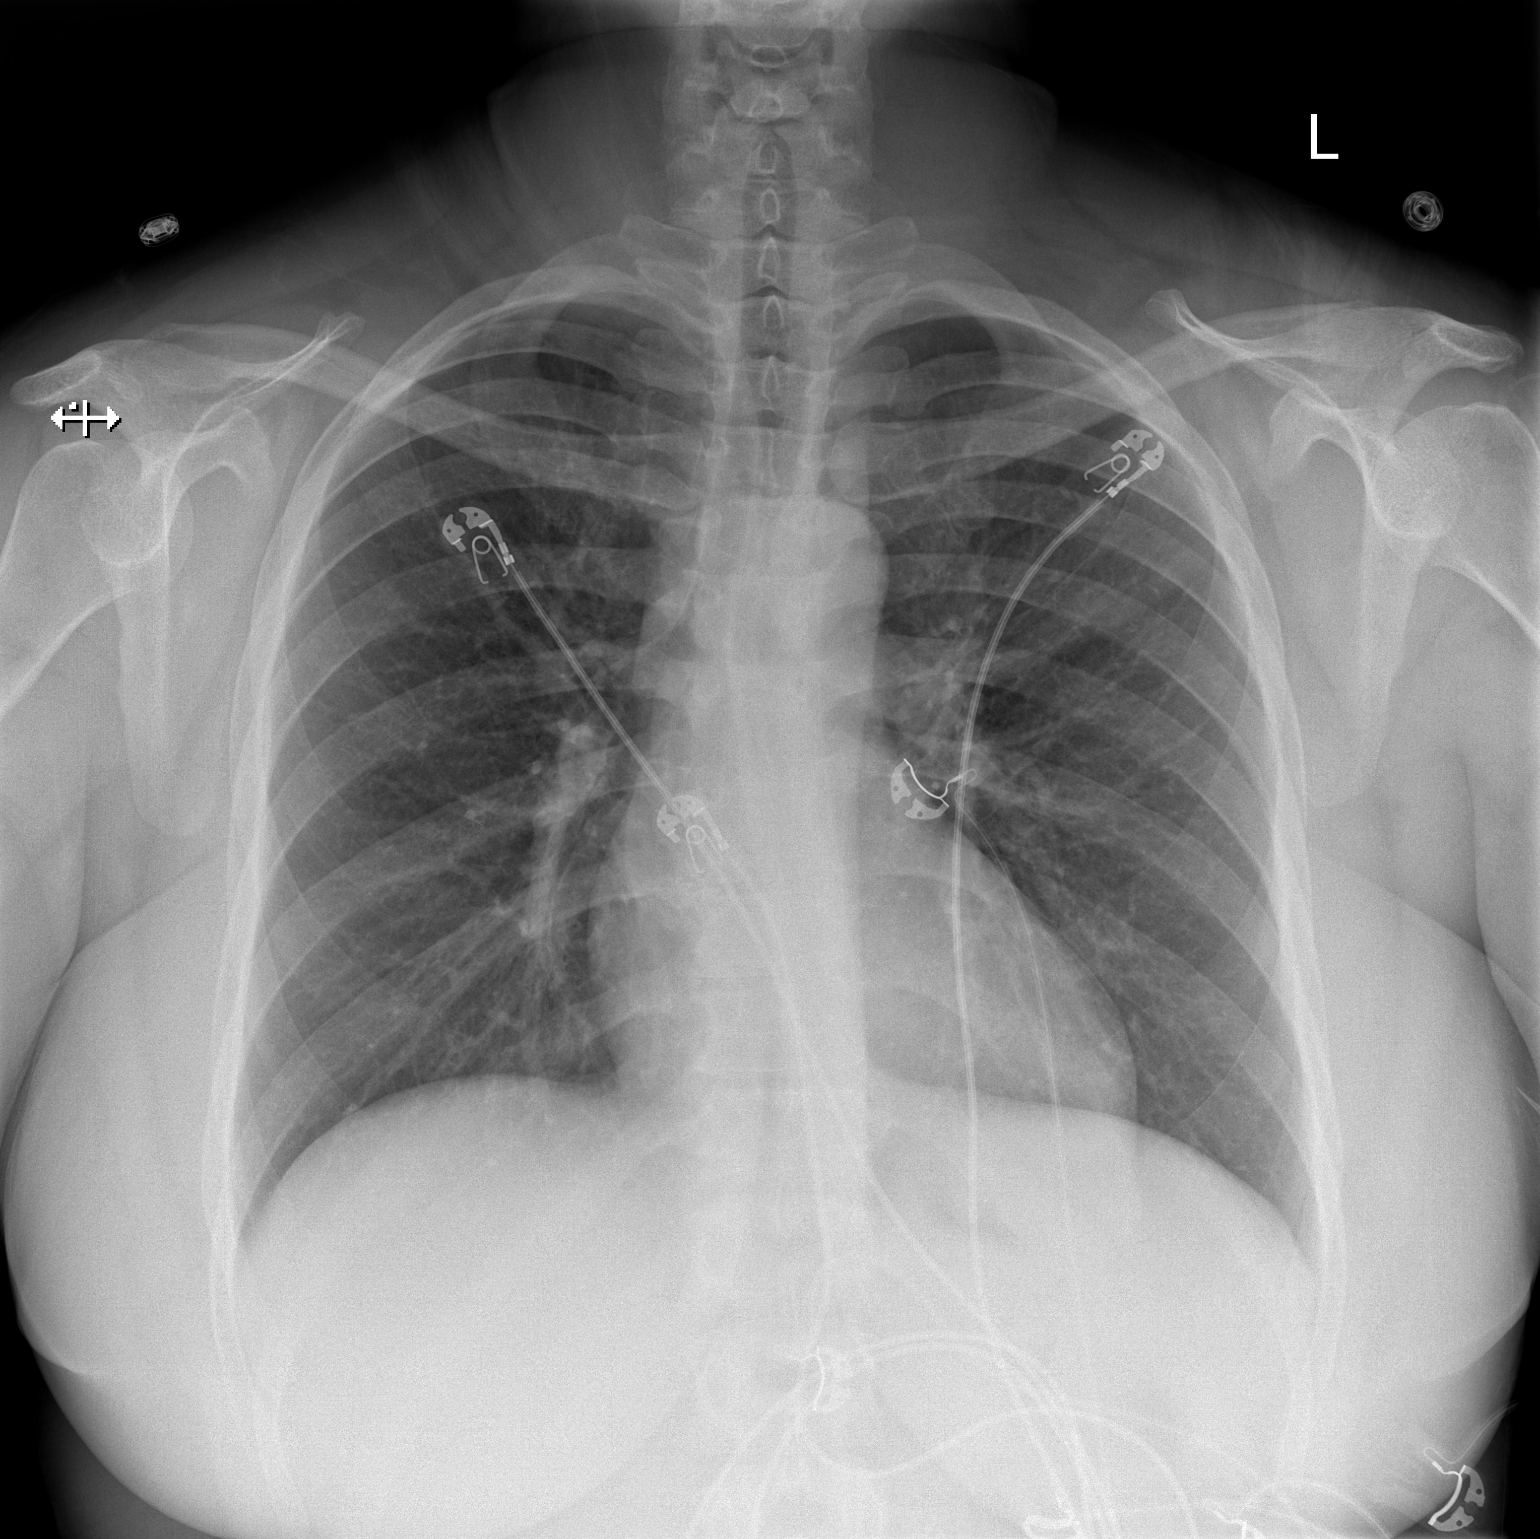

[w chest lat]
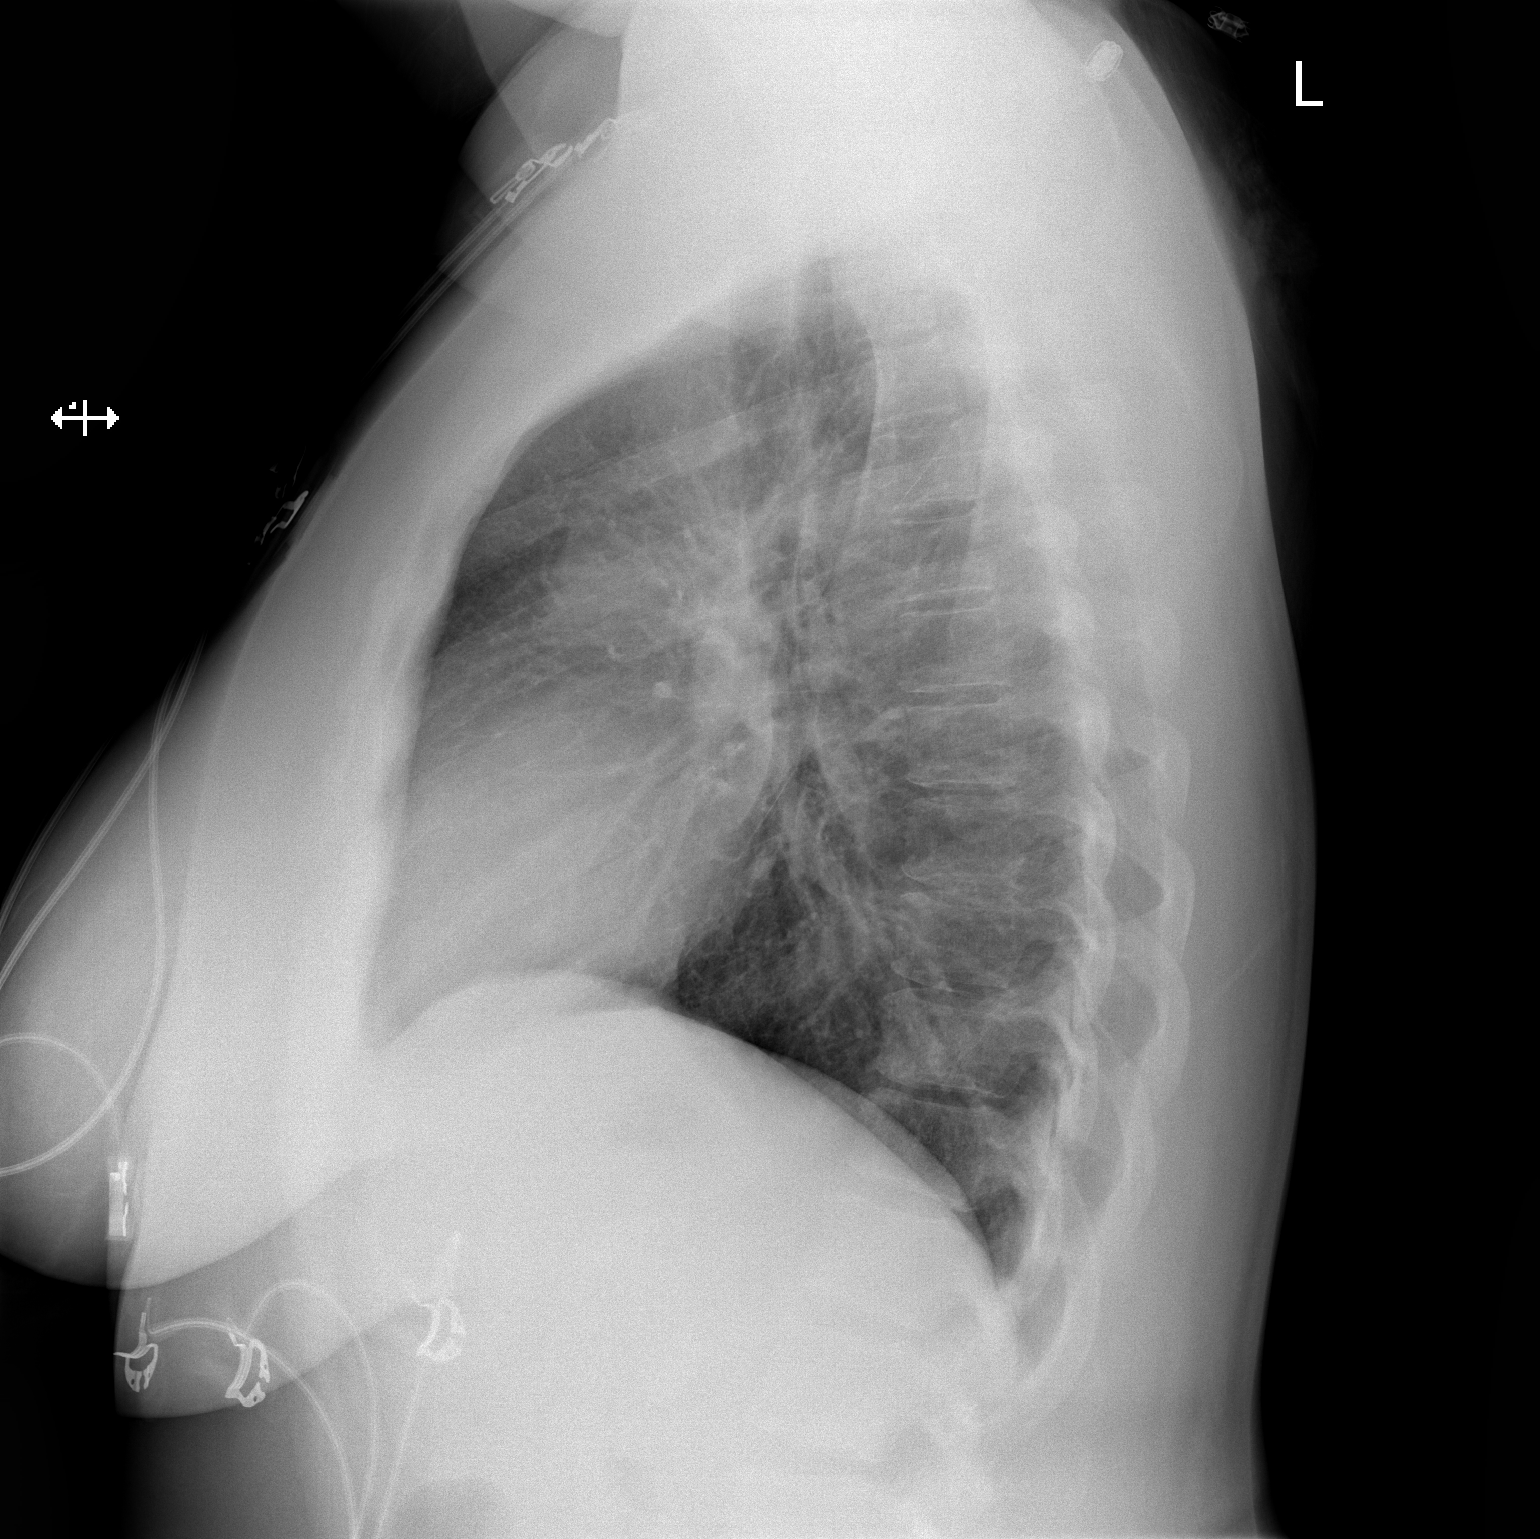

[2 of 2 positions shown; findings below may reference images not displayed]

FINDINGS: The heart size and mediastinal contours are within normal limits.
Both lungs are clear. The visualized skeletal structures are
unremarkable.
IMPRESSION: Normal exam.

## 2021-08-04 MED ORDER — GADOBUTROL 1 MMOL/ML IV SOLN
10.0000 mL | Freq: Once | INTRAVENOUS | Status: AC | PRN
  Administered 2021-08-04: 10 mL via INTRAVENOUS

## 2021-08-04 MED ORDER — HYDRALAZINE HCL 20 MG/ML IJ SOLN
10.0000 mg | Freq: Once | INTRAMUSCULAR | Status: AC
  Administered 2021-08-04: 10 mg via INTRAVENOUS
  Filled 2021-08-04: qty 1

## 2021-08-04 MED ORDER — ACETAMINOPHEN 325 MG PO TABS: 650.0000 mg | ORAL_TABLET | Freq: Four times a day (QID) | ORAL | Status: DC | PRN

## 2021-08-04 MED ORDER — TRAMADOL HCL 50 MG PO TABS
50.0000 mg | ORAL_TABLET | Freq: Once | ORAL | Status: AC
  Administered 2021-08-04: 50 mg via ORAL
  Filled 2021-08-04: qty 1

## 2021-08-04 NOTE — ED Triage Notes (Signed)
Patient complains of dizziness, skin itchy all over, left hip pain started 2 days ago.

## 2021-08-04 NOTE — ED Notes (Signed)
Patient to MRI via wheelchair.

## 2021-08-04 NOTE — H&P (Addendum)
History and Physical    Patient: Alexis Mccormick JQZ:[id number] DOB: 01/03/1986 DOA: 08/04/2021 DOS: the patient was seen and examined on 08/04/2021 PCP: Rosemarie Ax, MD  Patient coming from: Home  Chief Complaint:  Chief Complaint  Patient presents with   Dizziness    Patient complains of dizziness, skin itchy all over, left hip pain started 2 days ago.     HPI: Alexis Mccormick is a 36 y.o. female with medical history significant of asthma, iron deficiency anemia who presents with a myriad of symptoms.   Beginning in 8/22, has been having issues with vaginal pruritus and odorous discharge.Currently followed by OB/GYN and had recent hysteroscopy on 2/7 for thickened endometrium with polyp, uterine fibroid.  Biopsy of polyp reportedly benign. Then in October she noticed lumps beneath her arms that lasted about 3 days and resolved.  Then in November, she began to have midsternal chest pain with palpitation to the point that she could visualize pulsation when looking at her chest.  Had panic attacks at night and would wake up clammy.  She was evaluated by cardiology at [facility] and reportedly had negative Holter monitoring and echocardiogram. Also noticed "wrinkling" skin changes to her breast. Had mammogram yesterday on 2/24 negative for malignancy.  In December, she was diagnosed with an ear infection and had dizziness with exertion with feeling of fullness to her right ear.  However dizziness persist despite resolution of infection.  She reports having major dental procedure in October resulting in persistent right jaw pain. Now with frequent right-sided headache and right-sided vision changes.  Seen in ED on 2/11 with negative CT head.   Saw ENT and audiology on 2/23 for tinnitus with normal hearing bilaterally   Evaluated by nephrology on 2/8 for proteinuria and thought possible due to pre-hypertension with borderline BP. Advised lifestyle changes.   Saw sports medicine 2/23 and  was diagnosed with dysautonomia orthostatic hypotension syndrome, hypermobility syndrome.  ANA titer obtained which is pending.  Sed rate elevated at 50. CRP was normal. Rheumatoid factor was normal. CCP was negative.   Also notes unintentional 27lbs weight loss in the past few months. Bruising easily. Last few days has noticed bluish color changes of hands and bottom of feet when cold. Has felt numbness/tingling of right great toe. If she bends her joints, would notice more increase skin flushing.   Denies tobacco, alcohol or illicit drug use.  No family history of autoimmune disorder.  Mother has diabetes and dad has history of hypertension.No recent travels.Only has cats. Works as Freight forwarder at [employer].  In the ED, she was hypertensive up to 180s over 116, mildly tachycardic and stable on room air. No leukocytosis, hemoglobin of 11.3, normal platelets at 343. CMP unremarkable other than mild hypokalemia of 3.2. CK is normal.  BNP reassuring 18.  Troponin at 3. D-dimer is negative.  INR is normal at 1.1.  Quantitative hCG is negative.  Had MRI brain with orbits showing possible nonspecific gliosis/demyelination.  ED physician discussed with neurology Dr. Rory Percy who felt it was likely artifactual.  Thinks her neurological symptoms possibly due to migraine versus hypertension.    Review of Systems: As mentioned in the history of present illness. All other systems reviewed and are negative. Past Medical History:  Diagnosis Date   Anemia    Anxiety    Asthma    Carpal tunnel syndrome    right wrist   Chest pain 05/30/2021   ED visit for heart palpitations, chest pain, sob.  05/30/21 chest xray & EKG normal. Troponin levels normal.See ED OV in Epic from 05/30/21.   Chest pain 07/05/2021   Per 07/05/21 ED note from Dr. Laural Golden at [facility] in [place]., chest pain appeared to be musculoskeletal.   COVID 2020   very mild symptoms   Depression     Dizziness 04/2021   Dyspnea 07/05/2021   ED visit for CP & SOB.   Fibroid    Heart palpitations 04/2021   Hypertension    Patient has never been put on BP meds. Cardiology appt 07/19/21 with Dr. Tonia Ghent per pt.   Patellofemoral syndrome of both knees    Plantar fasciitis of right foot    Pre-diabetes    per pt   Wears contact lenses    Wears glasses    Past Surgical History:  Procedure Laterality Date   DILATATION & CURETTAGE/HYSTEROSCOPY WITH MYOSURE N/A 07/17/2021   Procedure: HYSTEROSCOPY WITH MYOSURE POLYPECTOMY;  Surgeon: Carlyon Shadow, MD;  Location: [facility];  Service: Gynecology;  Laterality: N/A;   ESOPHAGOGASTRODUODENOSCOPY  3614   GRAFT APPLICATION  4315   dental bone graft on lower right side   WISDOM TOOTH EXTRACTION  2022   Social History:  reports that she has never smoked. She has never used smokeless tobacco. She reports that she does not currently use alcohol. She reports that she does not use drugs. Works as a Freight forwarder at [employer] city.  Allergies  Allergen Reactions   Cefdinir Other (See Comments)    Headache and dizziness   Strawberry (Diagnostic) Swelling    Lips got puffy and itchy face.    Family History  Problem Relation Age of Onset   Diabetes Mother    Cancer Maternal Grandmother    Diabetes Maternal Grandfather    Breast cancer Neg Hx     Prior to Admission medications   Medication Sig Start Date End Date Taking? Authorizing Provider  acetaminophen (TYLENOL) 325 MG tablet Take 2 tablets (650 mg total) by mouth every 6 (six) hours as needed. Patient taking differently: Take 650 mg by mouth every 6 (six) hours as needed for mild pain, fever or headache. 07/17/21  Yes Carlyon Shadow, MD  ALPRAZolam Duanne Moron) 0.25 MG tablet Take 0.25 mg by mouth as needed for anxiety.   Yes [provider]  cyclobenzaprine (FLEXERIL) 10 MG tablet Take 1 tablet (10 mg total) by mouth 2 (two) times daily as needed for muscle spasms.  07/21/21  Yes Gareth Morgan, MD  hydrocortisone cream 1 % Apply 1 application topically 4 (four) times daily as needed for itching.   Yes [provider]  ibuprofen (ADVIL) 800 MG tablet Take 1 tablet (800 mg total) by mouth every 8 (eight) hours as needed. 07/17/21  Yes Carlyon Shadow, MD  Iron-FA-B Cmp-C-Biot-Probiotic (FUSION PLUS PO) Take 1 capsule by mouth daily. Patient needs to pick up prescription. Hasn't started yet. 07/10/21.   Yes [provider]  meclizine (ANTIVERT) 25 MG tablet Take 25 mg by mouth 2 (two) times daily as needed for dizziness. 07/11/21  Yes [provider]  metroNIDAZOLE (FLAGYL) 500 MG tablet Take 500 mg by mouth 2 (two) times daily. 7 day supply 07/25/21  Yes [provider]  Multiple Vitamin (MULTIVITAMIN WITH MINERALS) TABS tablet Take 1 tablet by mouth daily. Gummies   Yes [provider]  Vitamin D, Ergocalciferol, (DRISDOL) 1.25 MG (50000 UNIT) CAPS capsule Take 50,000 Units by  mouth once a week. 02/15/21  Yes [provider]  amLODipine (NORVASC) 5 MG tablet Take 1 tablet (5 mg total) by mouth daily. Patient not taking: Reported on 08/04/2021 07/21/21 08/20/21  Gareth Morgan, MD    Physical Exam: Vitals:   08/04/21 1505 08/04/21 1618 08/04/21 1700 08/04/21 1957  BP: (!) 188/102 138/78 (!) 130/91 (!) 187/116  Pulse: 66 72 76 66  Resp: $Remo'18 16 16 13  'MNuzu$ Temp:      TempSrc:      SpO2: 100% 100% 100% 100%  Weight:      Height:       Constitutional: NAD, calm, comfortable, middle-age female sitting upright in bed Eyes: lids and conjunctivae normal ENMT: Mucous membranes are moist. Posterior pharynx clear of any exudate but is mildly erythematous. Neck: normal, supple Respiratory: clear to auscultation bilaterally, no wheezing, no crackles. Normal respiratory effort. No accessory muscle use.  Cardiovascular: Tachycardic with notable pulsation of the left breast, no murmurs / rubs / gallops. No extremity edema.  2+ pedal pulses.  Abdomen: no tenderness, no masses palpated. Bowel sounds positive.  Musculoskeletal: no clubbing / cyanosis. No joint deformity upper and lower extremities. Good ROM, no contractures. Normal muscle tone.  Skin: Flat erythematous rash noted to the right dorsal hand.  No petechiae rash or bruising.  No cyanosis or discoloration of skin or extremities. Neurologic: CN 2-12 grossly intact.  Strength 5/5 in all 4.  Psychiatric: Normal judgment and insight. Alert and oriented x 3. Normal mood.  Data Reviewed:  See HPI  Assessment and Plan:  36 yo F with no significant PMH presenting with numerous symptoms including neuro, cardiac and skin syndromes with elevated blood pressure. Will pursue further autoimmune workup but also suspect anxiety as a major contributory factor.   Hypertensive urgency -BP up to 180/110. Also noted to have borderline elevated BP at times during recent outpatient visits.  -MRI brain showed nonspecific gliosis/demyelination thought likely artifactual by neurology Dr. Rory Percy -She was discharged with amlodipine at ED evaluation last week but did not start  -IV PRN hydralazine with perimeters overnight   Dizziness/HA/right vision changes  -possibly secondary to complex migraine vs uncontrolled HTN -tx as above -MRI brain finding likely artifactual per neurology  Palpitations and chest pain  -Troponin reassuring at 3. EKG with NSR and nonspecific T wave changes -reportedly had echocardiogram and holter monitoring recently at [facility] that I am unable to review -keep on continuous telemetry. Question whether more related to anxiety   Skin changes -not noted on exam but has been noting bluish discoloration or increasing erythema of skin at times. Given her other symptoms, concerning autoimmune disorder, possible scleroderm. Sounds to have Raynauds phenomenon -Had negative outpatient rheumatoid factor, CCP. ANA titer is pending -will also obtain TSH,  anti-scloderma antibody, anti-DNA   Hx of proteinuria  -Evaluated by nephrology on 2/8 for proteinuria and thought possible due to pre-hypertension with borderline BP. Resolved today with UA negative for protein.   Recent bacterial vaginosis -Continue home oral Flagyl   Advance Care Planning:   Code Status: Prior Full  Consults: none  Family Communication: spoke with pt and female friend at bedside   Severity of Illness: The appropriate patient status for this patient is OBSERVATION. Observation status is judged to be reasonable and necessary in order to provide the required intensity of service to ensure the patient's safety. The patient's presenting symptoms, physical exam findings, and initial radiographic and laboratory data in the context of their medical condition  is felt to place them at decreased risk for further clinical deterioration. Furthermore, it is anticipated that the patient will be medically stable for discharge from the hospital within 2 midnights of admission.   Author: Orene Desanctis, DO 08/04/2021 9:43 PM  For on call review [email or web address].
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
## 2021-08-05 ENCOUNTER — Other Ambulatory Visit: Payer: Self-pay

## 2021-08-05 DIAGNOSIS — R42 Dizziness and giddiness: Secondary | ICD-10-CM | POA: Diagnosis not present

## 2021-08-05 DIAGNOSIS — R079 Chest pain, unspecified: Secondary | ICD-10-CM | POA: Diagnosis not present

## 2021-08-05 DIAGNOSIS — I160 Hypertensive urgency: Secondary | ICD-10-CM | POA: Diagnosis not present

## 2021-08-05 DIAGNOSIS — R230 Cyanosis: Secondary | ICD-10-CM | POA: Diagnosis present

## 2021-08-05 LAB — RAPID URINE DRUG SCREEN, HOSP PERFORMED
Amphetamines: NOT DETECTED
Barbiturates: NOT DETECTED
Benzodiazepines: NOT DETECTED
Cocaine: NOT DETECTED
Opiates: NOT DETECTED
Tetrahydrocannabinol: NOT DETECTED

## 2021-08-05 LAB — CBC
HCT: 34.3 % — ABNORMAL LOW (ref 36.0–46.0)
Hemoglobin: 10.9 g/dL — ABNORMAL LOW (ref 12.0–15.0)
MCH: 25.7 pg — ABNORMAL LOW (ref 26.0–34.0)
MCHC: 31.8 g/dL (ref 30.0–36.0)
MCV: 80.9 fL (ref 80.0–100.0)
Platelets: 316 10*3/uL (ref 150–400)
RBC: 4.24 MIL/uL (ref 3.87–5.11)
RDW: 16.4 % — ABNORMAL HIGH (ref 11.5–15.5)
WBC: 6.4 10*3/uL (ref 4.0–10.5)
nRBC: 0 % (ref 0.0–0.2)

## 2021-08-05 LAB — URINALYSIS, ROUTINE W REFLEX MICROSCOPIC
Bilirubin Urine: NEGATIVE
Glucose, UA: NEGATIVE mg/dL
Ketones, ur: 5 mg/dL — AB
Leukocytes,Ua: NEGATIVE
Nitrite: NEGATIVE
Protein, ur: NEGATIVE mg/dL
Specific Gravity, Urine: 1.026 (ref 1.005–1.030)
pH: 5 (ref 5.0–8.0)

## 2021-08-05 LAB — IRON AND TIBC
Iron: 44 ug/dL (ref 28–170)
Saturation Ratios: 14 % (ref 10.4–31.8)
TIBC: 321 ug/dL (ref 250–450)
UIBC: 277 ug/dL

## 2021-08-05 LAB — VITAMIN B12: Vitamin B-12: 381 pg/mL (ref 180–914)

## 2021-08-05 LAB — BASIC METABOLIC PANEL
Anion gap: 7 (ref 5–15)
BUN: 11 mg/dL (ref 6–20)
CO2: 24 mmol/L (ref 22–32)
Calcium: 8.7 mg/dL — ABNORMAL LOW (ref 8.9–10.3)
Chloride: 104 mmol/L (ref 98–111)
Creatinine, Ser: 0.79 mg/dL (ref 0.44–1.00)
GFR, Estimated: >60 mL/min (ref >60)
Glucose, Bld: 95 mg/dL (ref 70–99)
Potassium: 3.2 mmol/L — ABNORMAL LOW (ref 3.5–5.1)
Sodium: 135 mmol/L (ref 135–145)

## 2021-08-05 LAB — FOLATE: Folate: 19.5 ng/mL (ref >5.9)

## 2021-08-05 LAB — TSH: TSH: 2.88 u[IU]/mL (ref 0.350–4.500)

## 2021-08-05 LAB — HIV ANTIBODY (ROUTINE TESTING W REFLEX): HIV Screen 4th Generation wRfx: NONREACTIVE

## 2021-08-05 MED ORDER — HYDRALAZINE HCL 20 MG/ML IJ SOLN: 5.0000 mg | Freq: Four times a day (QID) | INTRAMUSCULAR | Status: DC | PRN

## 2021-08-05 MED ORDER — POTASSIUM CHLORIDE 20 MEQ PO PACK
40.0000 meq | PACK | Freq: Two times a day (BID) | ORAL | Status: DC
  Administered 2021-08-05: 40 meq via ORAL
  Filled 2021-08-05: qty 2

## 2021-08-05 MED ORDER — ALPRAZOLAM 0.25 MG PO TABS: 0.2500 mg | ORAL_TABLET | ORAL | Status: DC | PRN

## 2021-08-05 MED ORDER — METRONIDAZOLE 500 MG PO TABS
500.0000 mg | ORAL_TABLET | Freq: Two times a day (BID) | ORAL | Status: DC
Start: 2021-08-05 — End: 2021-08-05
  Administered 2021-08-05: 500 mg via ORAL
  Filled 2021-08-05 (×2): qty 1

## 2021-08-05 NOTE — Assessment & Plan Note (Signed)
As per the patient.  Not present at the time of my evaluation.  Normal platelets.

## 2021-08-05 NOTE — Discharge Summary (Signed)
Physician Discharge Summary   Patient: Alexis Mccormick MRN: [id number] DOB: 02-04-1986  Admit date:     08/04/2021  Discharge date: 08/05/21  Discharge Physician: Flora Lipps   PCP: Rosemarie Ax, MD   Recommendations at discharge:   Recommend follow-up with your  primary care physician in 1 week and discuss about treatment for your symptoms.  Discharge Diagnoses: Principal Problem:   Hypertensive urgency Active Problems:   Dizziness   Bluish skin discoloration   Chest pain  Resolved Problems:   * No resolved hospital problems. *   Hospital Course: Alexis Mccormick is a 36 y.o. female with medical history significant of asthma, iron deficiency anemia presented to hospital with dizziness itching of the skin left hip pain and myriad of other symptoms.  She does have anxiety and panic attacks.  Complains of a right-sided headache vision changes chronic dental pain generalized pruritus dizziness.  Patient was seen by ENT and audiology on 2/23 for tinnitus with normal hearing bilaterally.  She was also evaluated by nephrology on 05/17/2022.  Proteinuria and thought to be secondary to prehypertension.  Patient was also seen by sports medicine on 2/23 and was diagnosed with dysautonomia orthostatic hypotension syndrome, hypermobility syndrome.  ANA titer obtained which is pending.  Sed rate elevated at 50. CRP was normal. Rheumatoid factor was normal. CCP was negative.  Patient also reported unintentional weight loss and bruising. In the ED, she was hypertensive up to 180s over 116, mildly tachycardic and stable on room air. No leukocytosis, hemoglobin of 11.3, normal platelets at 343. CMP unremarkable other than mild hypokalemia of 3.2. CK is normal.  BNP reassuring 18.  Troponin at 3. D-dimer is negative.  INR is normal at 1.1.  Quantitative hCG is negative. Had MRI brain with orbits showing possible nonspecific gliosis/demyelination.  ED physician discussed with neurology Dr. Rory Percy who felt it  was likely artifactual.  Impression was  neurological symptoms possibly due to migraine versus hypertension.  Assessment and Plan: * Hypertensive urgency- (present on admission) Improved with antihypertensives.  Patient was encouraged compliance with medication.  Continue amlodipine on discharge.  Chest pain Nonspecific.  X-ray was negative.  D-dimer was negative.  EKG showed normal sinus rhythm.  Bluish skin discoloration- (present on admission) As per the patient.  Not present at the time of my evaluation.  Normal platelets.  Dizziness Nonspecific at this time.  Could be secondary to hypertension.   Consultants: None Procedures performed: None Disposition: Home Diet recommendation:  Discharge Diet Orders (From admission, onward)     Start     Ordered   08/05/21 0000  Diet - low sodium heart healthy        08/05/21 1003           Cardiac diet  DISCHARGE MEDICATION: Allergies as of 08/05/2021       Reactions   Cefdinir Other (See Comments)   Headache and dizziness   Strawberry (diagnostic) Swelling   Lips got puffy and itchy face.        Medication List     TAKE these medications    acetaminophen 325 MG tablet Commonly known as: Tylenol Take 2 tablets (650 mg total) by mouth every 6 (six) hours as needed. What changed: reasons to take this   ALPRAZolam 0.25 MG tablet Commonly known as: XANAX Take 0.25 mg by mouth as needed for anxiety.   amLODipine 5 MG tablet Commonly known as: NORVASC Take 1 tablet (5 mg total) by mouth daily. Notes to patient:  08/05/2021    cyclobenzaprine 10 MG tablet Commonly known as: FLEXERIL Take 1 tablet (10 mg total) by mouth 2 (two) times daily as needed for muscle spasms.   FUSION PLUS PO Take 1 capsule by mouth daily. Patient needs to pick up prescription. Hasn't started yet. 07/10/21. Notes to patient: 08/05/2021    hydrocortisone cream 1 % Apply 1 application topically 4 (four) times daily as needed for itching.    ibuprofen 800 MG tablet Commonly known as: ADVIL Take 1 tablet (800 mg total) by mouth every 8 (eight) hours as needed.   meclizine 25 MG tablet Commonly known as: ANTIVERT Take 25 mg by mouth 2 (two) times daily as needed for dizziness.   metroNIDAZOLE 500 MG tablet Commonly known as: FLAGYL Take 500 mg by mouth 2 (two) times daily. 7 day supply Notes to patient: 08/05/2021 evening dose    multivitamin with minerals Tabs tablet Take 1 tablet by mouth daily. Gummies Notes to patient: 08/05/2021    Vitamin D (Ergocalciferol) 1.25 MG (50000 UNIT) Caps capsule Commonly known as: DRISDOL Take 50,000 Units by mouth once a week.       Subjective: Patient was seen and examined at bedside.  Complains of multiple symptoms.  Discharge Exam: Filed Weights   08/04/21 1456  Weight: 114.8 kg   Vitals with BMI 08/05/2021 08/05/2021 08/05/2021  Height - - -  Weight - - -  BMI - - -  Systolic 347 425 956  Diastolic 73 78 88  Pulse 74 63 82    BMP Latest Ref Rng & Units 08/05/2021 08/04/2021 07/21/2021  Glucose 70 - 99 mg/dL 95 87 92  BUN 6 - 20 mg/dL _0 Creatinine 0.44 - 1.00 mg/dL 0.79 0.75 0.69  Sodium 135 - 145 mmol/L 135 137 138  Potassium 3.5 - 5.1 mmol/L 3.2(L) 3.2(L) 3.8  Chloride 98 - 111 mmol/L 104 106 104  CO2 22 - 32 mmol/L _1 Calcium 8.9 - 10.3 mg/dL 8.7(L) 9.0 8.8(L)    General: Obese built, not in obvious distress HENT:   No scleral pallor or icterus noted. Oral mucosa is moist.  Chest:  Clear breath sounds.  Diminished breath sounds bilaterally. No crackles or wheezes.  CVS: S1 &S2 heard. No murmur.  Regular rate and rhythm. Abdomen: Soft, nontender, nondistended.  Bowel sounds are heard.   Extremities: No cyanosis, clubbing or edema.  Peripheral pulses are palpable. Psych: Alert, awake and oriented, normal mood CNS:  No cranial nerve deficits.  Power equal in all extremities.   Skin: Warm and dry.  No rashes noted.  Condition at discharge: good  The  results of significant diagnostics from this hospitalization (including imaging, microbiology, ancillary and laboratory) are listed below for reference.   Imaging Studies: DG Chest 2 View  Result Date: 08/04/2021 CLINICAL DATA:  Left chest pain and dizziness. Asthma. Hypertension. EXAM: CHEST - 2 VIEW COMPARISON:  07/21/2021 FINDINGS: The heart size and mediastinal contours are within normal limits. Both lungs are clear. The visualized skeletal structures are unremarkable. IMPRESSION: Normal exam. Electronically Signed   By: Marlaine Hind M.D.   On: 08/04/2021 17:54   DG Chest 2 View  Result Date: 07/21/2021 CLINICAL DATA:  Chest pain for 3 months.  No injury. EXAM: CHEST - 2 VIEW COMPARISON:  05/30/2021. FINDINGS: Normal heart, mediastinum and hila. Clear lungs.  No pleural effusion or pneumothorax. Skeletal structures are within normal limits. IMPRESSION: Normal chest radiographs. Electronically Signed   By: Shanon Brow  Ormond M.D.   On: 07/21/2021 09:30   CT Head Wo Contrast  Result Date: 07/21/2021 CLINICAL DATA:  Headache for 2 days. History of controlled hypertension. EXAM: CT HEAD WITHOUT CONTRAST TECHNIQUE: Contiguous axial images were obtained from the base of the skull through the vertex without intravenous contrast. RADIATION DOSE REDUCTION: This exam was performed according to the departmental dose-optimization program which includes automated exposure control, adjustment of the mA and/or kV according to patient size and/or use of iterative reconstruction technique. COMPARISON:  None. FINDINGS: Brain: No evidence of acute infarction, hemorrhage, hydrocephalus, extra-axial collection or mass lesion/mass effect. Vascular: No hyperdense vessel or unexpected calcification. Skull: Normal. Negative for fracture or focal lesion. Sinuses/Orbits: Normal globes and orbits. Visualized sinuses are clear. Other: None. IMPRESSION: Normal unenhanced CT scan of the brain. Electronically Signed   By: Lajean Manes  M.D.   On: 07/21/2021 09:29   MR Brain W and Wo Contrast  Result Date: 08/04/2021 CLINICAL DATA:  Vision loss, monocular; Vision loss, monocular right decreased vision EXAM: MRI HEAD AND ORBITS WITHOUT AND WITH CONTRAST TECHNIQUE: Multiplanar, multiecho pulse sequences of the brain and surrounding structures were obtained without and with intravenous contrast. Multiplanar, multiecho pulse sequences of the orbits and surrounding structures were obtained including fat saturation techniques, before and after intravenous contrast administration. CONTRAST:  53m GADAVIST GADOBUTROL 1 MMOL/ML IV SOLN COMPARISON:  None. FINDINGS: MRI HEAD FINDINGS Brain: No acute infarction or hemorrhage. Ventricles and sulci are normal in size and configuration. There is no intracranial mass, mass effect, edema, hydrocephalus, or extra-axial collection. Few scattered small foci T2 hyperintensity in the supratentorial primarily subcortical white matter. There is also involvement of the right internal capsule. No abnormal enhancement. Vascular: Major vessel flow voids at the skull base are preserved. Skull and upper cervical spine: Marrow signal is within normal limits. Other: Sella is unremarkable.  Mastoid air cells are clear. MRI ORBITS FINDINGS Orbits: No proptosis. No intraorbital mass. Globes, lacrimal glands, and extraocular muscles are symmetric and unremarkable. There is no abnormal enhancement of the optic nerve sheath complexes. Visualized sinuses: No significant opacification. Soft tissues: Unremarkable. IMPRESSION: No evidence of recent infarction, hemorrhage, or mass. No abnormal enhancement. Mild burden of T2 hyperintense foci in the cerebral white matter likely reflecting nonspecific gliosis/demyelination. Electronically Signed   By: PMacy MisM.D.   On: 08/04/2021 19:14   UKoreaBREAST LTD UNI LEFT INC AXILLA  Result Date: 08/03/2021 CLINICAL DATA:  Nonfocal pain on the right. Focal pain in the left breast. EXAM:  DIGITAL DIAGNOSTIC BILATERAL MAMMOGRAM WITH TOMOSYNTHESIS AND CAD; ULTRASOUND LEFT BREAST LIMITED TECHNIQUE: Bilateral digital diagnostic mammography and breast tomosynthesis was performed. The images were evaluated with computer-aided detection.; Targeted ultrasound examination of the left breast was performed. COMPARISON:  None. ACR Breast Density Category c: The breast tissue is heterogeneously dense, which may obscure small masses. FINDINGS: No suspicious masses, calcifications, or distortion identified in either breast. No mammographic evidence of malignancy. Targeted ultrasound is performed, showing no suspicious findings in the region of the patient's focal left breast pain. IMPRESSION: No mammographic or sonographic evidence of malignancy. RECOMMENDATION: Treatment of the patient's symptoms should be based on clinical and physical exam given lack of imaging findings. Recommend annual screening mammography beginning at the age of 45 I have discussed the findings and recommendations with the patient. If applicable, a reminder letter will be sent to the patient regarding the next appointment. BI-RADS CATEGORY  1: Negative. Electronically Signed   By: DDorise BullionIII  M.D.   On: 08/03/2021 10:40  MM DIAG BREAST TOMO BILATERAL  Result Date: 08/03/2021 CLINICAL DATA:  Nonfocal pain on the right. Focal pain in the left breast. EXAM: DIGITAL DIAGNOSTIC BILATERAL MAMMOGRAM WITH TOMOSYNTHESIS AND CAD; ULTRASOUND LEFT BREAST LIMITED TECHNIQUE: Bilateral digital diagnostic mammography and breast tomosynthesis was performed. The images were evaluated with computer-aided detection.; Targeted ultrasound examination of the left breast was performed. COMPARISON:  None. ACR Breast Density Category c: The breast tissue is heterogeneously dense, which may obscure small masses. FINDINGS: No suspicious masses, calcifications, or distortion identified in either breast. No mammographic evidence of malignancy. Targeted  ultrasound is performed, showing no suspicious findings in the region of the patient's focal left breast pain. IMPRESSION: No mammographic or sonographic evidence of malignancy. RECOMMENDATION: Treatment of the patient's symptoms should be based on clinical and physical exam given lack of imaging findings. Recommend annual screening mammography beginning at the age of 45. I have discussed the findings and recommendations with the patient. If applicable, a reminder letter will be sent to the patient regarding the next appointment. BI-RADS CATEGORY  1: Negative. Electronically Signed   By: Dorise Bullion III M.D.   On: 08/03/2021 10:40  DG Hip Unilat W or Wo Pelvis 2-3 Views Left  Result Date: 08/04/2021 CLINICAL DATA:  Left hip pain for 2 days.  No known injury. EXAM: DG HIP (WITH OR WITHOUT PELVIS) 2-3V LEFT COMPARISON:  08/17/2020 FINDINGS: There is no evidence of hip fracture or dislocation. There is no evidence of arthropathy or other focal bone abnormality. IMPRESSION: Negative. Electronically Signed   By: Marlaine Hind M.D.   On: 08/04/2021 17:53   MR ORBITS W WO CONTRAST  Result Date: 08/04/2021 CLINICAL DATA:  Vision loss, monocular; Vision loss, monocular right decreased vision EXAM: MRI HEAD AND ORBITS WITHOUT AND WITH CONTRAST TECHNIQUE: Multiplanar, multiecho pulse sequences of the brain and surrounding structures were obtained without and with intravenous contrast. Multiplanar, multiecho pulse sequences of the orbits and surrounding structures were obtained including fat saturation techniques, before and after intravenous contrast administration. CONTRAST:  62m GADAVIST GADOBUTROL 1 MMOL/ML IV SOLN COMPARISON:  None. FINDINGS: MRI HEAD FINDINGS Brain: No acute infarction or hemorrhage. Ventricles and sulci are normal in size and configuration. There is no intracranial mass, mass effect, edema, hydrocephalus, or extra-axial collection. Few scattered small foci T2 hyperintensity in the  supratentorial primarily subcortical white matter. There is also involvement of the right internal capsule. No abnormal enhancement. Vascular: Major vessel flow voids at the skull base are preserved. Skull and upper cervical spine: Marrow signal is within normal limits. Other: Sella is unremarkable.  Mastoid air cells are clear. MRI ORBITS FINDINGS Orbits: No proptosis. No intraorbital mass. Globes, lacrimal glands, and extraocular muscles are symmetric and unremarkable. There is no abnormal enhancement of the optic nerve sheath complexes. Visualized sinuses: No significant opacification. Soft tissues: Unremarkable. IMPRESSION: No evidence of recent infarction, hemorrhage, or mass. No abnormal enhancement. Mild burden of T2 hyperintense foci in the cerebral white matter likely reflecting nonspecific gliosis/demyelination. Electronically Signed   By: PMacy MisM.D.   On: 08/04/2021 19:14    Microbiology: Results for orders placed or performed during the hospital encounter of 08/04/21  Resp Panel by RT-PCR (Flu A&B, Covid) Nasopharyngeal Swab     Status: None   Collection Time: 08/04/21  9:50 PM   Specimen: Nasopharyngeal Swab; Nasopharyngeal(NP) swabs in vial transport medium  Result Value Ref Range Status   SARS Coronavirus 2 by RT PCR NEGATIVE  NEGATIVE Final    Comment: (NOTE) SARS-CoV-2 target nucleic acids are NOT DETECTED.  The SARS-CoV-2 RNA is generally detectable in upper respiratory specimens during the acute phase of infection. The lowest concentration of SARS-CoV-2 viral copies this assay can detect is 138 copies/mL. A negative result does not preclude SARS-Cov-2 infection and should not be used as the sole basis for treatment or other patient management decisions. A negative result may occur with  improper specimen collection/handling, submission of specimen other than nasopharyngeal swab, presence of viral mutation(s) within the areas targeted by this assay, and inadequate number  of viral copies(<138 copies/mL). A negative result must be combined with clinical observations, patient history, and epidemiological information. The expected result is Negative.  Fact Sheet for Patients:  EntrepreneurPulse.com.au  Fact Sheet for Healthcare Providers:  IncredibleEmployment.be  This test is no t yet approved or cleared by the [place] FDA and  has been authorized for detection and/or diagnosis of SARS-CoV-2 by FDA under an Emergency Use Authorization (EUA). This EUA will remain  in effect (meaning this test can be used) for the duration of the COVID-19 declaration under Section 564(b)(1) of the Act, 21 U.S.C.section 360bbb-3(b)(1), unless the authorization is terminated  or revoked sooner.       Influenza A by PCR NEGATIVE NEGATIVE Final   Influenza B by PCR NEGATIVE NEGATIVE Final    Comment: (NOTE) The Xpert Xpress SARS-CoV-2/FLU/RSV plus assay is intended as an aid in the diagnosis of influenza from Nasopharyngeal swab specimens and should not be used as a sole basis for treatment. Nasal washings and aspirates are unacceptable for Xpert Xpress SARS-CoV-2/FLU/RSV testing.  Fact Sheet for Patients: EntrepreneurPulse.com.au  Fact Sheet for Healthcare Providers: IncredibleEmployment.be  This test is not yet approved or cleared by the [place] FDA and has been authorized for detection and/or diagnosis of SARS-CoV-2 by FDA under an Emergency Use Authorization (EUA). This EUA will remain in effect (meaning this test can be used) for the duration of the COVID-19 declaration under Section 564(b)(1) of the Act, 21 U.S.C. section 360bbb-3(b)(1), unless the authorization is terminated or revoked.  Performed at [facility], [place] [address], [place], [place] [address]     Labs: CBC: Recent Labs  Lab 08/02/21 1120 08/04/21 1619 08/05/21 0358  WBC 4.7 7.4 6.4   NEUTROABS 3.1 4.8  --   HGB 11.2 11.3* 10.9*  HCT 34.3 36.5 34.3*  MCV 78* 81.3 80.9  PLT 307 343 588   Basic Metabolic Panel: Recent Labs  Lab 08/04/21 1619 08/05/21 0358  NA 137 135  K 3.2* 3.2*  CL 106 104  CO2 25 24  GLUCOSE 87 95  BUN 11 11  CREATININE 0.75 0.79  CALCIUM 9.0 8.7*  MG 1.9  --    Liver Function Tests: Recent Labs  Lab 08/04/21 1619  AST 18  ALT 16  ALKPHOS 48  BILITOT 0.2*  PROT 7.9  ALBUMIN 3.9   CBG: No results for input(s): GLUCAP in the last 168 hours.  Discharge time spent: greater than 30 minutes.  Signed: Flora Lipps, MD [facility] 08/05/2021

## 2021-08-05 NOTE — Hospital Course (Signed)
Alexis Mccormick is a 36 y.o. female with medical history significant of asthma, iron deficiency anemia presented to hospital with dizziness itching of the skin left hip pain and myriad of other symptoms.  She does have anxiety and panic attacks.  Complains of a right-sided headache vision changes chronic dental pain generalized pruritus dizziness.  Patient was seen by ENT and audiology on 2/23 for tinnitus with normal hearing bilaterally.  She was also evaluated by nephrology on 05/17/2022.  Proteinuria and thought to be secondary to prehypertension.  Patient was also seen by sports medicine on 2/23 and was diagnosed with dysautonomia orthostatic hypotension syndrome, hypermobility syndrome.  ANA titer obtained which is pending.  Sed rate elevated at 50. CRP was normal. Rheumatoid factor was normal. CCP was negative.  Patient also reported unintentional weight loss and bruising. In the ED, she was hypertensive up to 180s over 116, mildly tachycardic and stable on room air. No leukocytosis, hemoglobin of 11.3, normal platelets at 343. CMP unremarkable other than mild hypokalemia of 3.2. CK is normal.  BNP reassuring 18.  Troponin at 3. D-dimer is negative.  INR is normal at 1.1.  Quantitative hCG is negative. Had MRI brain with orbits showing possible nonspecific gliosis/demyelination.  ED physician discussed with neurology Dr. Rory Percy who felt it was likely artifactual.  Impression was  neurological symptoms possibly due to migraine versus hypertension.

## 2021-08-05 NOTE — Assessment & Plan Note (Signed)
Nonspecific.  X-ray was negative.  D-dimer was negative.  EKG showed normal sinus rhythm.

## 2021-08-05 NOTE — Assessment & Plan Note (Signed)
Improved with antihypertensives.  Patient was encouraged compliance with medication.  Continue amlodipine on discharge.

## 2021-08-05 NOTE — TOC Transition Note (Signed)
Transition of Care [facility]) - CM/SW Discharge Note   Patient Details  Name: Halena Mohar MRN: [id number] Date of Birth: [birth year]  Transition of Care [facility]) CM/SW Contact:  Ross Ludwig, LCSW Phone Number: 08/05/2021, 10:56 AM   Clinical Narrative:      Transition of Care [facility]) Screening Note   Patient Details  Name: Deshannon Seide Date of Birth: May 30, 1986   Transition of Care [facility]) CM/SW Contact:    Ross Ludwig, LCSW Phone Number: 08/05/2021, 10:56 AM    Transition of Care Department [facility] has reviewed patient and no TOC needs have been identified at this time. We will continue to monitor patient advancement through interdisciplinary progression rounds. If new patient transition needs arise, please place a TOC consult.          Patient Goals and CMS Choice        Discharge Placement                       Discharge Plan and Services                                     Social Determinants of Health (SDOH) Interventions     Readmission Risk Interventions No flowsheet data found.

## 2021-08-05 NOTE — Plan of Care (Signed)
Discharge instructions reviewed with patient, verbalized understanding. Patient ambulatory to main entrance to be taken home by friend.

## 2021-08-05 NOTE — Assessment & Plan Note (Signed)
Nonspecific at this time.  Could be secondary to hypertension.

## 2021-08-06 LAB — ANTI-DNA ANTIBODY, DOUBLE-STRANDED: ds DNA Ab: 1 IU/mL (ref 0–9)

## 2021-08-06 LAB — URINE CULTURE: Culture: 20000 — AB

## 2021-08-06 LAB — ANTI-SCLERODERMA ANTIBODY: Scleroderma (Scl-70) (ENA) Antibody, IgG: <0.2 AI (ref 0.0–0.9)

## 2021-08-07 ENCOUNTER — Encounter: Payer: Self-pay | Admitting: Family Medicine

## 2021-08-07 ENCOUNTER — Other Ambulatory Visit: Payer: Self-pay

## 2021-08-07 ENCOUNTER — Encounter (HOSPITAL_BASED_OUTPATIENT_CLINIC_OR_DEPARTMENT_OTHER): Payer: Self-pay

## 2021-08-07 ENCOUNTER — Other Ambulatory Visit: Payer: 59

## 2021-08-07 ENCOUNTER — Ambulatory Visit (INDEPENDENT_AMBULATORY_CARE_PROVIDER_SITE_OTHER): Payer: 59 | Admitting: Family Medicine

## 2021-08-07 ENCOUNTER — Emergency Department (HOSPITAL_BASED_OUTPATIENT_CLINIC_OR_DEPARTMENT_OTHER)
Admission: EM | Admit: 2021-08-07 | Discharge: 2021-08-07 | Disposition: A | Discharge status: Home / Self Care | Payer: 59 | Attending: Emergency Medicine | Admitting: Emergency Medicine

## 2021-08-07 VITALS — BP 112/80 | Ht 64.0 in | Wt 248.0 lb

## 2021-08-07 DIAGNOSIS — Z79899 Other long term (current) drug therapy: Secondary | ICD-10-CM | POA: Insufficient documentation

## 2021-08-07 DIAGNOSIS — R0781 Pleurodynia: Secondary | ICD-10-CM

## 2021-08-07 DIAGNOSIS — M357 Hypermobility syndrome: Secondary | ICD-10-CM

## 2021-08-07 DIAGNOSIS — R002 Palpitations: Secondary | ICD-10-CM | POA: Diagnosis not present

## 2021-08-07 DIAGNOSIS — R1013 Epigastric pain: Secondary | ICD-10-CM | POA: Insufficient documentation

## 2021-08-07 DIAGNOSIS — D508 Other iron deficiency anemias: Secondary | ICD-10-CM | POA: Diagnosis not present

## 2021-08-07 DIAGNOSIS — E876 Hypokalemia: Secondary | ICD-10-CM | POA: Insufficient documentation

## 2021-08-07 DIAGNOSIS — R0789 Other chest pain: Secondary | ICD-10-CM

## 2021-08-07 LAB — COMPREHENSIVE METABOLIC PANEL
ALT: 15 U/L (ref 0–44)
AST: 17 U/L (ref 15–41)
Albumin: 3.7 g/dL (ref 3.5–5.0)
Alkaline Phosphatase: 51 U/L (ref 38–126)
Anion gap: 9 (ref 5–15)
BUN: 7 mg/dL (ref 6–20)
CO2: 23 mmol/L (ref 22–32)
Calcium: 8.8 mg/dL — ABNORMAL LOW (ref 8.9–10.3)
Chloride: 103 mmol/L (ref 98–111)
Creatinine, Ser: 0.66 mg/dL (ref 0.44–1.00)
GFR, Estimated: >60 mL/min (ref >60)
Glucose, Bld: 105 mg/dL — ABNORMAL HIGH (ref 70–99)
Potassium: 3.3 mmol/L — ABNORMAL LOW (ref 3.5–5.1)
Sodium: 135 mmol/L (ref 135–145)
Total Bilirubin: 0.4 mg/dL (ref 0.3–1.2)
Total Protein: 8.1 g/dL (ref 6.5–8.1)

## 2021-08-07 LAB — CBC WITH DIFFERENTIAL/PLATELET
Abs Immature Granulocytes: 0.01 10*3/uL (ref 0.00–0.07)
Basophils Absolute: 0 10*3/uL (ref 0.0–0.1)
Basophils Relative: 1 %
Eosinophils Absolute: 0.1 10*3/uL (ref 0.0–0.5)
Eosinophils Relative: 1 %
HCT: 35 % — ABNORMAL LOW (ref 36.0–46.0)
Hemoglobin: 11.3 g/dL — ABNORMAL LOW (ref 12.0–15.0)
Immature Granulocytes: 0 %
Lymphocytes Relative: 27 %
Lymphs Abs: 1.6 10*3/uL (ref 0.7–4.0)
MCH: 25.6 pg — ABNORMAL LOW (ref 26.0–34.0)
MCHC: 32.3 g/dL (ref 30.0–36.0)
MCV: 79.4 fL — ABNORMAL LOW (ref 80.0–100.0)
Monocytes Absolute: 0.5 10*3/uL (ref 0.1–1.0)
Monocytes Relative: 8 %
Neutro Abs: 3.7 10*3/uL (ref 1.7–7.7)
Neutrophils Relative %: 63 %
Platelets: 357 10*3/uL (ref 150–400)
RBC: 4.41 MIL/uL (ref 3.87–5.11)
RDW: 16.1 % — ABNORMAL HIGH (ref 11.5–15.5)
WBC: 5.8 10*3/uL (ref 4.0–10.5)
nRBC: 0 % (ref 0.0–0.2)

## 2021-08-07 LAB — TROPONIN I (HIGH SENSITIVITY): Troponin I (High Sensitivity): 2 ng/L (ref <18)

## 2021-08-07 MED ORDER — LORAZEPAM 1 MG PO TABS
1.0000 mg | ORAL_TABLET | Freq: Once | ORAL | Status: AC
  Administered 2021-08-07: 1 mg via ORAL
  Filled 2021-08-07: qty 1

## 2021-08-07 MED ORDER — NORTRIPTYLINE HCL 10 MG PO CAPS: 10.0000 mg | ORAL_CAPSULE | Freq: Every day | ORAL | 1 refills | Status: DC

## 2021-08-07 NOTE — Patient Instructions (Signed)
Good to see you Please try the medicine  I will call with the results.   Please send me a message in MyChart with any questions or updates.  Please see me back as scheduled.   --Dr. Raeford Razor

## 2021-08-07 NOTE — Progress Notes (Signed)
°  Alexis Mccormick - 36 y.o. female MRN [id number]  Date of birth: 1986-05-10  SUBJECTIVE:  Including CC & ROS.  No chief complaint on file.   Alexis Mccormick is a 36 y.o. female that is following up for ongoing chest pain, her easy bruising and headaches and dizziness.  She was seen in the emergency department recently..  Hemoglobin was found to be low on lab work from 2/28. She had a normal TSH on 2/26. She had a normal folate and B12 on 2/25.  Review of Systems See HPI   HISTORY: Past Medical, Surgical, Social, and Family History Reviewed & Updated per EMR.   Pertinent Historical Findings include:  Past Medical History:  Diagnosis Date   Anemia    Anxiety    Asthma    Carpal tunnel syndrome    right wrist   Chest pain 05/30/2021   ED visit for heart palpitations, chest pain, sob. 05/30/21 chest xray & EKG normal. Troponin levels normal.See ED OV in Epic from 05/30/21.   Chest pain 07/05/2021   Per 07/05/21 ED note from Dr. Laural Golden at [facility] in [place]., chest pain appeared to be musculoskeletal.   COVID 2020   very mild symptoms   Depression    Dizziness 04/2021   Dyspnea 07/05/2021   ED visit for CP & SOB.   Fibroid    Heart palpitations 04/2021   Hypertension    Patient has never been put on BP meds. Cardiology appt 07/19/21 with Dr. Tonia Ghent per pt.   Patellofemoral syndrome of both knees    Plantar fasciitis of right foot    Pre-diabetes    per pt   Wears contact lenses    Wears glasses     Past Surgical History:  Procedure Laterality Date   DILATATION & CURETTAGE/HYSTEROSCOPY WITH MYOSURE N/A 07/17/2021   Procedure: HYSTEROSCOPY WITH MYOSURE POLYPECTOMY;  Surgeon: Carlyon Shadow, MD;  Location: [facility];  Service: Gynecology;  Laterality: N/A;   ESOPHAGOGASTRODUODENOSCOPY  5732   GRAFT APPLICATION  2025   dental bone graft on lower right side   WISDOM TOOTH EXTRACTION  2022     PHYSICAL  EXAM:  VS: BP 112/80 (BP Location: Left Arm, Patient Position: Sitting)    Ht 5\' 4"  (1.626 m)    Wt 248 lb (112.5 kg)    LMP 08/04/2021 (Exact Date)    BMI 42.57 kg/m  Physical Exam Gen: NAD, alert, cooperative with exam, well-appearing MSK:  Neurovascularly intact       ASSESSMENT & PLAN:   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  Having symptoms that seem consistent with her hypermobility.  Continues to have vertigo as well as migraines. -Counseled on home exercise therapy and supportive care. -Nortriptyline   Costal margin pain Still acutely occurring.  Cardiac work-up has been negative.  Nonspecific inflammatory marker was elevated. -Counseled on home exercise therapy and supportive care. -Could consider further imaging.  Absolute anemia Iron was found to be normal but previous ferritin was found to be at 16. -Referral to hematology.

## 2021-08-07 NOTE — Assessment & Plan Note (Signed)
Iron was found to be normal but previous ferritin was found to be at 16. -Referral to hematology.

## 2021-08-07 NOTE — ED Provider Notes (Signed)
[facility] EMERGENCY DEPARTMENT Provider Note   CSN: [id number] Arrival date & time: 08/07/21  0142     History  Chief Complaint  Patient presents with   Chest Pain    Alexis Mccormick is a 36 y.o. female.  The history is provided by the patient and medical records.  Chest Pain Alexis Mccormick is a 36 y.o. female who presents to the Emergency Department complaining of chest pain.  She presents to the emergency department accompanied by her mother for evaluation of chest pain.  She reports 1 month of intermittent pain described as a racing and burning feeling in her chest.  She has associated cough, dry mouth and excessive thirst.  She had diarrhea the last 2 days, 1 episode of loose stools daily.  She has associated slight epigastric discomfort and reports dry skin and an uncomfortable feeling in her skin. She did have a hysteroscopy on February 7 for uterine polyps.  She was recently diagnosed with hypertension and started on amlodipine a few days ago.  She is compliant with this medication.  She also takes over-the-counter multivitamins, vitamin D and iron.  She was having night sweats until 1 month ago, these are now gone.  LMP was Saturday, it was normal for her.  She reports no fever, no lower extremity edema.  She has a family history of diabetes.  She does report weight loss.  In November she was 260 pounds and now she is down to 248 pounds.  She has had 2 ED visits and one overnight hospital observation for similar symptoms.  She has been seen by her primary care provider as well.  Work-up for her symptoms include multiple labs including inflammatory markers, ANA, troponins, thyroid studies, MRI brain, chest x-ray, D-dimer.  Studies to this point have not identified a cause for her symptoms.    Home Medications Prior to Admission medications   Medication Sig Start Date End Date Taking? Authorizing Provider  acetaminophen (TYLENOL) 325 MG tablet Take 2 tablets (650 mg total)  by mouth every 6 (six) hours as needed. Patient taking differently: Take 650 mg by mouth every 6 (six) hours as needed for mild pain, fever or headache. 07/17/21   Carlyon Shadow, MD  ALPRAZolam Duanne Moron) 0.25 MG tablet Take 0.25 mg by mouth as needed for anxiety.    [provider]  amLODipine (NORVASC) 5 MG tablet Take 1 tablet (5 mg total) by mouth daily. Patient not taking: Reported on 08/04/2021 07/21/21 08/20/21  Gareth Morgan, MD  cyclobenzaprine (FLEXERIL) 10 MG tablet Take 1 tablet (10 mg total) by mouth 2 (two) times daily as needed for muscle spasms. 07/21/21   Gareth Morgan, MD  hydrocortisone cream 1 % Apply 1 application topically 4 (four) times daily as needed for itching.    [provider]  ibuprofen (ADVIL) 800 MG tablet Take 1 tablet (800 mg total) by mouth every 8 (eight) hours as needed. 07/17/21   Carlyon Shadow, MD  Iron-FA-B Cmp-C-Biot-Probiotic (FUSION PLUS PO) Take 1 capsule by mouth daily. Patient needs to pick up prescription. Hasn't started yet. 07/10/21.    [provider]  meclizine (ANTIVERT) 25 MG tablet Take 25 mg by mouth 2 (two) times daily as needed for dizziness. 07/11/21   [provider]  metroNIDAZOLE (FLAGYL) 500 MG tablet Take 500 mg by mouth 2 (two) times daily. 7 day supply 07/25/21   [provider]  Multiple Vitamin (MULTIVITAMIN WITH MINERALS) TABS tablet Take 1 tablet by mouth  daily. Gummies    [provider]  Vitamin D, Ergocalciferol, (DRISDOL) 1.25 MG (50000 UNIT) CAPS capsule Take 50,000 Units by mouth once a week. 02/15/21   [provider]      Allergies    Cefdinir and Strawberry (diagnostic)    Review of Systems   Review of Systems  Cardiovascular:  Positive for chest pain.  All other systems reviewed and are negative.  Physical Exam Updated Vital Signs BP (!) 152/95    Pulse 88    Temp 98.4 F (36.9 C) (Oral)    Resp (!) 22    Ht 5\' 4"  (1.626 m)    Wt 112.5 kg    LMP  08/04/2021 (Exact Date)    SpO2 99%    BMI 42.57 kg/m  Physical Exam Vitals and nursing note reviewed.  Constitutional:      Appearance: She is well-developed.  HENT:     Head: Normocephalic and atraumatic.  Cardiovascular:     Rate and Rhythm: Normal rate and regular rhythm.     Heart sounds: No murmur heard. Pulmonary:     Effort: Pulmonary effort is normal. No respiratory distress.     Breath sounds: Normal breath sounds.  Abdominal:     Palpations: Abdomen is soft.     Tenderness: There is no abdominal tenderness. There is no guarding or rebound.  Musculoskeletal:        General: No swelling or tenderness.  Skin:    General: Skin is warm and dry.  Neurological:     Mental Status: She is alert and oriented to person, place, and time.  Psychiatric:        Behavior: Behavior normal.    ED Results / Procedures / Treatments   Labs (all labs ordered are listed, but only abnormal results are displayed) Labs Reviewed  CBC WITH DIFFERENTIAL/PLATELET - Abnormal; Notable for the following components:      Result Value   Hemoglobin 11.3 (*)    HCT 35.0 (*)    MCV 79.4 (*)    MCH 25.6 (*)    RDW 16.1 (*)    All other components within normal limits  COMPREHENSIVE METABOLIC PANEL - Abnormal; Notable for the following components:   Potassium 3.3 (*)    Glucose, Bld 105 (*)    Calcium 8.8 (*)    All other components within normal limits  TROPONIN I (HIGH SENSITIVITY)    EKG EKG Interpretation  Date/Time:  Tuesday August 07 2021 01:52:41 EST Ventricular Rate:  74 PR Interval:  113 QRS Duration: 94 QT Interval:  365 QTC Calculation: 405 R Axis:   55 Text Interpretation: Sinus rhythm Borderline short PR interval Borderline T abnormalities, diffuse leads No significant change since last tracing Confirmed by Quintella Reichert [phone]) on 08/07/2021 1:54:43 AM  Radiology No results found.  Procedures Procedures    Medications Ordered in ED Medications  LORazepam  (ATIVAN) tablet 1 mg (1 mg Oral Given 08/07/21 0231)    ED Course/ Medical Decision Making/ A&P                           Medical Decision Making Amount and/or Complexity of Data Reviewed Labs: ordered.  Risk Prescription drug management.   Patient here for evaluation of multiple complaints, predominant complaint is chest pain and palpitations.  She appears anxious but is nontoxic-appearing on evaluation.  EKG without acute ischemic changes and troponin is negative.  No significant change in symptoms  after Ativan.  Recent hospitalization and ED visits reviewed.  She had negative D-dimer, troponins on those visits.  Negative chest x-ray.  Negative MRI brain.  Current clinical picture is not consistent with PE, dissection, pneumonia, CHF.  She does have mild hypokalemia.  Hypokalemia does not appear to be contributing to her symptoms.  Discussed increasing potassium in her diet with foods.  Discussed with patient unclear source of symptoms.  Feel she is stable for ongoing outpatient follow-up and work-up for her symptoms.        Final Clinical Impression(s) / ED Diagnoses Final diagnoses:  Atypical chest pain  Palpitations  Hypokalemia    Rx / DC Orders ED Discharge Orders     None         Quintella Reichert, MD 08/07/21 [id number]

## 2021-08-07 NOTE — Assessment & Plan Note (Signed)
Still acutely occurring.  Cardiac work-up has been negative.  Nonspecific inflammatory marker was elevated. -Counseled on home exercise therapy and supportive care. -Could consider further imaging.

## 2021-08-07 NOTE — Assessment & Plan Note (Signed)
Acute on chronic in nature.  Having symptoms that seem consistent with her hypermobility.  Continues to have vertigo as well as migraines. -Counseled on home exercise therapy and supportive care. -Nortriptyline

## 2021-08-07 NOTE — Discharge Instructions (Addendum)
The cause of your symptoms was not identified.  Please follow up with your family doctor for further evaluation.

## 2021-08-07 NOTE — ED Triage Notes (Signed)
Pt states she started having chest pains a few days ago and has had diarrhea.  Pt states she feels dry and feels like her heart is beating fast and has shortness of breath. Pt ambulatory to room, NAD noted. Seen previously for same.

## 2021-08-10 ENCOUNTER — Other Ambulatory Visit: Payer: Self-pay

## 2021-08-10 ENCOUNTER — Inpatient Hospital Stay: Payer: 59 | Attending: Hematology & Oncology

## 2021-08-10 ENCOUNTER — Encounter: Payer: Self-pay | Admitting: Family

## 2021-08-10 ENCOUNTER — Other Ambulatory Visit: Payer: Self-pay | Admitting: *Deleted

## 2021-08-10 ENCOUNTER — Inpatient Hospital Stay (HOSPITAL_BASED_OUTPATIENT_CLINIC_OR_DEPARTMENT_OTHER): Payer: 59 | Admitting: Family

## 2021-08-10 VITALS — BP 124/86 | HR 64 | Temp 98.1°F | Resp 18 | Ht 64.0 in | Wt 251.4 lb

## 2021-08-10 DIAGNOSIS — H8111 Benign paroxysmal vertigo, right ear: Secondary | ICD-10-CM

## 2021-08-10 DIAGNOSIS — M222X1 Patellofemoral disorders, right knee: Secondary | ICD-10-CM

## 2021-08-10 DIAGNOSIS — K5900 Constipation, unspecified: Secondary | ICD-10-CM | POA: Insufficient documentation

## 2021-08-10 DIAGNOSIS — D509 Iron deficiency anemia, unspecified: Secondary | ICD-10-CM | POA: Diagnosis not present

## 2021-08-10 DIAGNOSIS — M23204 Derangement of unspecified medial meniscus due to old tear or injury, left knee: Secondary | ICD-10-CM

## 2021-08-10 DIAGNOSIS — M24852 Other specific joint derangements of left hip, not elsewhere classified: Secondary | ICD-10-CM

## 2021-08-10 DIAGNOSIS — M722 Plantar fascial fibromatosis: Secondary | ICD-10-CM

## 2021-08-10 DIAGNOSIS — Z809 Family history of malignant neoplasm, unspecified: Secondary | ICD-10-CM

## 2021-08-10 DIAGNOSIS — H66001 Acute suppurative otitis media without spontaneous rupture of ear drum, right ear: Secondary | ICD-10-CM

## 2021-08-10 DIAGNOSIS — I10 Essential (primary) hypertension: Secondary | ICD-10-CM | POA: Insufficient documentation

## 2021-08-10 DIAGNOSIS — M7050 Other bursitis of knee, unspecified knee: Secondary | ICD-10-CM

## 2021-08-10 LAB — CBC WITH DIFFERENTIAL (CANCER CENTER ONLY)
Abs Immature Granulocytes: 0.07 10*3/uL (ref 0.00–0.07)
Basophils Absolute: 0 10*3/uL (ref 0.0–0.1)
Basophils Relative: 0 %
Eosinophils Absolute: 0 10*3/uL (ref 0.0–0.5)
Eosinophils Relative: 0 %
HCT: 35.9 % — ABNORMAL LOW (ref 36.0–46.0)
Hemoglobin: 11.4 g/dL — ABNORMAL LOW (ref 12.0–15.0)
Immature Granulocytes: 1 %
Lymphocytes Relative: 21 %
Lymphs Abs: 1.1 10*3/uL (ref 0.7–4.0)
MCH: 25.4 pg — ABNORMAL LOW (ref 26.0–34.0)
MCHC: 31.8 g/dL (ref 30.0–36.0)
MCV: 80 fL (ref 80.0–100.0)
Monocytes Absolute: 0.3 10*3/uL (ref 0.1–1.0)
Monocytes Relative: 6 %
Neutro Abs: 3.9 10*3/uL (ref 1.7–7.7)
Neutrophils Relative %: 72 %
Platelet Count: 372 10*3/uL (ref 150–400)
RBC: 4.49 MIL/uL (ref 3.87–5.11)
RDW: 16.2 % — ABNORMAL HIGH (ref 11.5–15.5)
WBC Count: 5.4 10*3/uL (ref 4.0–10.5)
nRBC: 0 % (ref 0.0–0.2)

## 2021-08-10 LAB — SAVE SMEAR(SSMR), FOR PROVIDER SLIDE REVIEW

## 2021-08-10 LAB — IRON AND IRON BINDING CAPACITY (CC-WL,HP ONLY)
Iron: 31 ug/dL (ref 28–170)
Saturation Ratios: 9 % — ABNORMAL LOW (ref 10.4–31.8)
TIBC: 351 ug/dL (ref 250–450)
UIBC: 320 ug/dL (ref 148–442)

## 2021-08-10 LAB — CMP (CANCER CENTER ONLY)
ALT: 12 U/L (ref 0–44)
AST: 13 U/L — ABNORMAL LOW (ref 15–41)
Albumin: 4 g/dL (ref 3.5–5.0)
Alkaline Phosphatase: 54 U/L (ref 38–126)
Anion gap: 6 (ref 5–15)
BUN: 6 mg/dL (ref 6–20)
CO2: 28 mmol/L (ref 22–32)
Calcium: 9.3 mg/dL (ref 8.9–10.3)
Chloride: 106 mmol/L (ref 98–111)
Creatinine: 0.75 mg/dL (ref 0.44–1.00)
GFR, Estimated: >60 mL/min (ref >60)
Glucose, Bld: 100 mg/dL — ABNORMAL HIGH (ref 70–99)
Potassium: 4.3 mmol/L (ref 3.5–5.1)
Sodium: 140 mmol/L (ref 135–145)
Total Bilirubin: 0.3 mg/dL (ref 0.3–1.2)
Total Protein: 7.7 g/dL (ref 6.5–8.1)

## 2021-08-10 LAB — RETICULOCYTES
Immature Retic Fract: 6.7 % (ref 2.3–15.9)
RBC.: 4.47 MIL/uL (ref 3.87–5.11)
Retic Count, Absolute: 55.9 10*3/uL (ref 19.0–186.0)
Retic Ct Pct: 1.3 % (ref 0.4–3.1)

## 2021-08-10 LAB — FERRITIN: Ferritin: 18 ng/mL (ref 11–307)

## 2021-08-10 NOTE — Progress Notes (Signed)
Hematology/Oncology Consultation   Name: Alexis Mccormick      MRN: [id number]    Location: Room/bed info not found  Date: 08/10/2021 Time:8:57 AM   REFERRING PHYSICIAN: Clearance Coots, MD   REASON FOR CONSULT: Iron deficiency anemia    DIAGNOSIS: Iron deficiency anemia   HISTORY OF PRESENT ILLNESS: Alexis Mccormick is a very pleasant 36 yo female with history of intermittent iron deficiency anemia.  She is currently taking an oral iron supplement daily. She notes constipation and change in color of her stool to green. She is taking Metamucil daily.  She is symptomatic with fatigue, insomnia, palpitations and burning/tingling in her hands and feet first this in the morning when she gets up.  Her cycle is described as regular and only heavy the first day. No other blood loss noted. No abnormal bruising, no petechiae.  She states that she had an Endoscopy in November 2022 and had 1 benign nodule removed. She states that she had CT of the abdomen and it was negative so no colonoscopy performed.  She does not eat red meat.  No family history of anemia.  No personal history of cancer. Her maternal grandmother had pancreatic cancer with Whipple.  No fever, chills, n/v, cough, rash, SOB, chest pain, abdominal pain or changes in bladder habits.  She has history of vertigo with dizziness and blurred vision as well as orthostatic hypotension.  Most recently she had hypertension with dizziness in the ED in February. She is tolerating Norvasc nicely so far.  No history of diabetes or thyroid disease.  She states that she had hystoscopy with 2 benign poyps removed in February.  No swelling or tenderness in her extremities.  No falls or syncope to report. She has a good appetite and is staying well hydrated throughout the day.  No smoking, ETOH or recreational drug use.  She stays quite busy working for [employer]. She does a lot of walking with her job.   ROS: All other 10 point review of systems is negative.    PAST MEDICAL HISTORY:   Past Medical History:  Diagnosis Date   Anemia    Anxiety    Asthma    Carpal tunnel syndrome    right wrist   Chest pain 05/30/2021   ED visit for heart palpitations, chest pain, sob. 05/30/21 chest xray & EKG normal. Troponin levels normal.See ED OV in Epic from 05/30/21.   Chest pain 07/05/2021   Per 07/05/21 ED note from Dr. Laural Golden at [facility] in [place]., chest pain appeared to be musculoskeletal.   COVID 2020   very mild symptoms   Depression    Dizziness 04/2021   Dyspnea 07/05/2021   ED visit for CP & SOB.   Fibroid    Heart palpitations 04/2021   Hypertension    Patient has never been put on BP meds. Cardiology appt 07/19/21 with Dr. Tonia Ghent per pt.   Patellofemoral syndrome of both knees    Plantar fasciitis of right foot    Pre-diabetes    per pt   Wears contact lenses    Wears glasses     ALLERGIES: Allergies  Allergen Reactions   Cefdinir Other (See Comments)    Headache and dizziness   Strawberry (Diagnostic) Swelling    Lips got puffy and itchy face.      MEDICATIONS:  Current Outpatient Medications on File Prior to Visit  Medication Sig Dispense Refill   acetaminophen (TYLENOL) 325 MG  tablet Take 2 tablets (650 mg total) by mouth every 6 (six) hours as needed. (Patient taking differently: Take 650 mg by mouth every 6 (six) hours as needed for mild pain, fever or headache.) 30 tablet 0   ALPRAZolam (XANAX) 0.25 MG tablet Take 0.25 mg by mouth as needed for anxiety.     amLODipine (NORVASC) 5 MG tablet Take 1 tablet (5 mg total) by mouth daily. (Patient not taking: Reported on 08/04/2021) 30 tablet 0   cyclobenzaprine (FLEXERIL) 10 MG tablet Take 1 tablet (10 mg total) by mouth 2 (two) times daily as needed for muscle spasms. 20 tablet 0   hydrocortisone cream 1 % Apply 1 application topically 4 (four) times daily as needed for itching.     ibuprofen (ADVIL) 800 MG tablet Take 1  tablet (800 mg total) by mouth every 8 (eight) hours as needed. 30 tablet 0   Iron-FA-B Cmp-C-Biot-Probiotic (FUSION PLUS PO) Take 1 capsule by mouth daily. Patient needs to pick up prescription. Hasn't started yet. 07/10/21.     meclizine (ANTIVERT) 25 MG tablet Take 25 mg by mouth 2 (two) times daily as needed for dizziness.     metroNIDAZOLE (FLAGYL) 500 MG tablet Take 500 mg by mouth 2 (two) times daily. 7 day supply     Multiple Vitamin (MULTIVITAMIN WITH MINERALS) TABS tablet Take 1 tablet by mouth daily. Gummies     nortriptyline (PAMELOR) 10 MG capsule Take 1 capsule (10 mg total) by mouth at bedtime. 45 capsule 1   Vitamin D, Ergocalciferol, (DRISDOL) 1.25 MG (50000 UNIT) CAPS capsule Take 50,000 Units by mouth once a week.     No current facility-administered medications on file prior to visit.     PAST SURGICAL HISTORY Past Surgical History:  Procedure Laterality Date   DILATATION & CURETTAGE/HYSTEROSCOPY WITH MYOSURE N/A 07/17/2021   Procedure: HYSTEROSCOPY WITH MYOSURE POLYPECTOMY;  Surgeon: Carlyon Shadow, MD;  Location: [facility];  Service: Gynecology;  Laterality: N/A;   ESOPHAGOGASTRODUODENOSCOPY  2376   GRAFT APPLICATION  2831   dental bone graft on lower right side   WISDOM TOOTH EXTRACTION  2022    FAMILY HISTORY: Family History  Problem Relation Age of Onset   Diabetes Mother    Cancer Maternal Grandmother    Diabetes Maternal Grandfather    Breast cancer Neg Hx     SOCIAL HISTORY:  reports that she has never smoked. She has never used smokeless tobacco. She reports that she does not currently use alcohol. She reports that she does not use drugs.  PERFORMANCE STATUS: The patient's performance status is 1 - Symptomatic but completely ambulatory  PHYSICAL EXAM: Most Recent Vital Signs: Last menstrual period 08/04/2021. BP 124/86 (BP Location: Left Arm, Patient Position: Sitting)    Pulse 64    Temp 98.1 F (36.7 C) (Oral)    Resp 18    Ht  5\' 4"  (1.626 m)    Wt 251 lb 6.4 oz (114 kg)    LMP 08/04/2021 (Exact Date)    SpO2 100%    BMI 43.15 kg/m   General Appearance:    Alert, cooperative, no distress, appears stated age  Head:    Normocephalic, without obvious abnormality, atraumatic  Eyes:    PERRL, conjunctiva/corneas clear, EOM's intact, fundi    benign, both eyes        Throat:   Lips, mucosa, and tongue normal; teeth and gums normal  Neck:   Supple, symmetrical, trachea midline, no adenopathy;  thyroid:  no enlargement/tenderness/nodules; no carotid   bruit or JVD  Back:     Symmetric, no curvature, ROM normal, no CVA tenderness  Lungs:     Clear to auscultation bilaterally, respirations unlabored  Chest Wall:    No tenderness or deformity   Heart:    Regular rate and rhythm, S1 and S2 normal, no murmur, rub   or gallop     Abdomen:     Soft, non-tender, bowel sounds active all four quadrants,    no masses, no organomegaly        Extremities:   Extremities normal, atraumatic, no cyanosis or edema  Pulses:   2+ and symmetric all extremities  Skin:   Skin color, texture, turgor normal, no rashes or lesions  Lymph nodes:   Cervical, supraclavicular, and axillary nodes normal  Neurologic:   CNII-XII intact, normal strength, sensation and reflexes    throughout    LABORATORY DATA:  Results for orders placed or performed in visit on 08/10/21 (from the past 48 hour(s))  CBC with Differential (Cancer Center Only)     Status: Abnormal   Collection Time: 08/10/21  8:34 AM  Result Value Ref Range   WBC Count 5.4 4.0 - 10.5 K/uL   RBC 4.49 3.87 - 5.11 MIL/uL   Hemoglobin 11.4 (L) 12.0 - 15.0 g/dL   HCT 35.9 (L) 36.0 - 46.0 %   MCV 80.0 80.0 - 100.0 fL   MCH 25.4 (L) 26.0 - 34.0 pg   MCHC 31.8 30.0 - 36.0 g/dL   RDW 16.2 (H) 11.5 - 15.5 %   Platelet Count 372 150 - 400 K/uL   nRBC 0.0 0.0 - 0.2 %   Neutrophils Relative % 72 %   Neutro Abs 3.9 1.7 - 7.7 K/uL   Lymphocytes Relative 21 %   Lymphs Abs 1.1 0.7 -  4.0 K/uL   Monocytes Relative 6 %   Monocytes Absolute 0.3 0.1 - 1.0 K/uL   Eosinophils Relative 0 %   Eosinophils Absolute 0.0 0.0 - 0.5 K/uL   Basophils Relative 0 %   Basophils Absolute 0.0 0.0 - 0.1 K/uL   Immature Granulocytes 1 %   Abs Immature Granulocytes 0.07 0.00 - 0.07 K/uL    Comment: Performed at [facility] at [facility], [address] [place], [place] [address]  Reticulocytes     Status: None   Collection Time: 08/10/21  8:34 AM  Result Value Ref Range   Retic Ct Pct 1.3 0.4 - 3.1 %   RBC. 4.47 3.87 - 5.11 MIL/uL   Retic Count, Absolute 55.9 19.0 - 186.0 K/uL   Immature Retic Fract 6.7 2.3 - 15.9 %    Comment: Performed at [facility] at [facility], [address] [place], [place] [address]  Save Smear [facility])     Status: None   Collection Time: 08/10/21  8:34 AM  Result Value Ref Range   Smear Review SMEAR STAINED AND AVAILABLE FOR REVIEW     Comment: Performed at [facility] at [facility], [address], [place], [place] [address]      RADIOGRAPHY: No results found.     PATHOLOGY: None   ASSESSMENT/PLAN: Ms. Diener is a very pleasant 36 yo female with history of intermittent iron deficiency anemia.  Anemia panel is pending. We will get her set up for IV iron and start her on folic acid if  needed.  Follow-up in 3 months.   All questions were answered. The patient knows to call the clinic with any problems, questions or concerns. We can certainly see the patient much sooner if necessary.  Lottie Dawson, NP

## 2021-08-11 LAB — PATHOLOGIST SMEAR REVIEW
Basophils Absolute: 0 10*3/uL (ref 0.0–0.2)
Basos: 1 %
EOS (ABSOLUTE): 0 10*3/uL (ref 0.0–0.4)
Eos: 0 %
Hematocrit: 34.3 % (ref 34.0–46.6)
Hemoglobin: 11.2 g/dL (ref 11.1–15.9)
Immature Grans (Abs): 0 10*3/uL (ref 0.0–0.1)
Immature Granulocytes: 0 %
Lymphocytes Absolute: 1.2 10*3/uL (ref 0.7–3.1)
Lymphs: 25 %
MCH: 25.5 pg — ABNORMAL LOW (ref 26.6–33.0)
MCHC: 32.7 g/dL (ref 31.5–35.7)
MCV: 78 fL — ABNORMAL LOW (ref 79–97)
Monocytes Absolute: 0.4 10*3/uL (ref 0.1–0.9)
Monocytes: 8 %
Neutrophils Absolute: 3.1 10*3/uL (ref 1.4–7.0)
Neutrophils: 66 %
Platelets: 307 10*3/uL (ref 150–450)
RBC: 4.4 x10E6/uL (ref 3.77–5.28)
RDW: 15.7 % — ABNORMAL HIGH (ref 11.7–15.4)
WBC: 4.7 10*3/uL (ref 3.4–10.8)

## 2021-08-11 LAB — ANA,IFA RA DIAG PNL W/RFLX TIT/PATN
ANA Titer 1: NEGATIVE
Cyclic Citrullin Peptide Ab: 3 units (ref 0–19)
Rheumatoid fact SerPl-aCnc: <10 IU/mL (ref <14.0)

## 2021-08-11 LAB — SEDIMENTATION RATE: Sed Rate: 50 mm/hr — ABNORMAL HIGH (ref 0–32)

## 2021-08-11 LAB — C-REACTIVE PROTEIN: CRP: 3 mg/L (ref 0–10)

## 2021-08-13 ENCOUNTER — Telehealth: Payer: Self-pay | Admitting: *Deleted

## 2021-08-13 ENCOUNTER — Other Ambulatory Visit: Payer: Self-pay | Admitting: Family

## 2021-08-13 ENCOUNTER — Telehealth: Payer: Self-pay | Admitting: Internal Medicine

## 2021-08-13 ENCOUNTER — Telehealth: Payer: Self-pay | Admitting: Family Medicine

## 2021-08-13 DIAGNOSIS — D509 Iron deficiency anemia, unspecified: Secondary | ICD-10-CM | POA: Insufficient documentation

## 2021-08-13 DIAGNOSIS — R0781 Pleurodynia: Secondary | ICD-10-CM

## 2021-08-13 LAB — HGB FRACTIONATION CASCADE
Hgb A2: 2.5 % (ref 1.8–3.2)
Hgb A: 97.5 % (ref 96.4–98.8)
Hgb F: 0 % (ref 0.0–2.0)
Hgb S: 0 %

## 2021-08-13 NOTE — Telephone Encounter (Signed)
Spoke with the patient who states that when she wakes up in the morning she is lightheaded and shaky. She can also feel her heart beating in her throat. She states that she previously saw a cardiologist at [facility] and they ordered a Holter Monitor and she was told the results were normal. Reports previous episodes of panic attacks in the middle of the night. Patient is taking Lexapro and xanax PRN. Patient also reports feeling weak and "dry". She reports constipation as well. She currently is not having any palpitations or chest pain. Does not feel lightheaded or dizzy. She usually only has these feelings when she first wakes up in the morning. I have encouraged the patient to increase her fluid intake.  ?Patient has a consult with Dr. Lovena Le in April.  ?

## 2021-08-13 NOTE — Telephone Encounter (Signed)
Informed of her lab results.  She continues to have chest pain with normal x-rays.  We will pursue an MRI of the chest to evaluate for a stress fracture versus inflammatory origin. ? ?Rosemarie Ax, MD ?[facility] ?08/13/2021, 8:53 AM ? ?

## 2021-08-13 NOTE — Telephone Encounter (Signed)
Alexis Leavell, RN ? ?Pt is not yet established with our office.  We can offer her a sooner appointment if one is available.    ? ?Discussed with patient and advised the scheduler would be reaching out with earlier appt if we have one. Encouraged patient again to increase her fluid intake.  ?Verbalized understanding.   ?

## 2021-08-13 NOTE — Telephone Encounter (Signed)
STAT if patient feels like he/she is going to faint  ? ?Are you dizzy now? No  ? ?Do you feel faint or have you passed out? No but feels loopy ? ?Do you have any other symptoms? Lightheadedness, heart racing,  ? ?Have you checked your HR and BP (record if available)? 153/90; 100  ?

## 2021-08-13 NOTE — Telephone Encounter (Signed)
-----   Message from Volanda Napoleon, MD sent at 08/10/2021  4:20 PM EST ----- ?Call let her know that her iron is very low.  She clearly needs IV iron.  Please set this up.  Please have Judson Roch put in the iron orders for [place].  Thanks.  Pete ?

## 2021-08-13 NOTE — Telephone Encounter (Signed)
Per Dr. Marin Olp, I informed the patient that the iron level is low. You need some IV iron. She verbalized understanding. LOS sent to scheduling. She will need three doses of IV Venofer.  ?

## 2021-08-14 ENCOUNTER — Ambulatory Visit: Payer: 59 | Admitting: Physical Therapy

## 2021-08-14 ENCOUNTER — Inpatient Hospital Stay: Payer: 59

## 2021-08-14 ENCOUNTER — Other Ambulatory Visit: Payer: Self-pay

## 2021-08-14 VITALS — BP 138/77 | HR 75 | Temp 98.7°F | Resp 18

## 2021-08-14 DIAGNOSIS — D509 Iron deficiency anemia, unspecified: Secondary | ICD-10-CM

## 2021-08-14 MED ORDER — SODIUM CHLORIDE 0.9 % IV SOLN: Freq: Once | INTRAVENOUS | Status: AC

## 2021-08-14 MED ORDER — SODIUM CHLORIDE 0.9 % IV SOLN
300.0000 mg | Freq: Once | INTRAVENOUS | Status: AC
  Administered 2021-08-14: 300 mg via INTRAVENOUS
  Filled 2021-08-14: qty 300

## 2021-08-14 NOTE — Patient Instructions (Signed)

## 2021-08-15 ENCOUNTER — Ambulatory Visit (INDEPENDENT_AMBULATORY_CARE_PROVIDER_SITE_OTHER): Payer: 59 | Admitting: Internal Medicine

## 2021-08-15 ENCOUNTER — Encounter: Payer: Self-pay | Admitting: *Deleted

## 2021-08-15 VITALS — BP 110/70 | HR 69 | Ht 64.0 in | Wt 249.8 lb

## 2021-08-15 DIAGNOSIS — I951 Orthostatic hypotension: Secondary | ICD-10-CM | POA: Diagnosis not present

## 2021-08-15 DIAGNOSIS — I208 Other forms of angina pectoris: Secondary | ICD-10-CM

## 2021-08-15 DIAGNOSIS — R079 Chest pain, unspecified: Secondary | ICD-10-CM | POA: Diagnosis not present

## 2021-08-15 NOTE — Patient Instructions (Addendum)
Medication Instructions:  ?Your physician recommends that you continue on your current medications as directed. Please refer to the Current Medication list given to you today. ? ?*If you need a refill on your cardiac medications before your next appointment, please call your pharmacy* ? ? ?Lab Work: ?Your physician recommends that you return for lab work for: renin aldosterone level & 24 hour metanephrines urine testing ? ?If you have labs (blood work) drawn today and your tests are completely normal, you will receive your results only by: ?MyChart Message (if you have MyChart) OR ?A paper copy in the mail ?If you have any lab test that is abnormal or we need to change your treatment, we will call you to review the results. ? ? ?Testing/Procedures: ?Your physician has requested that you have en exercise stress myoview. For further information please visit [email or web address]. Please follow instruction sheet, as given. ? ? ?Follow-Up: ?At [facility], you and your health needs are our priority.  As part of our continuing mission to provide you with exceptional heart care, we have created designated Provider Care Teams.  These Care Teams include your primary Cardiologist (physician) and Advanced Practice Providers (APPs -  Physician Assistants and Nurse Practitioners) who all work together to provide you with the care you need, when you need it. ? ?Your next appointment:   ?To be  determined ? ?The format for your next appointment:   ?In Person ? ?Provider:   ?Virl Axe, MD ? ? ? ?Thank you for choosing [facility]!! ? ? ? ? ?Other Instructions ? ? 24-Hour Urine Collection ?Why am I having this test? ?A 24-hour urine specimen is a lab test that requires you to collect all of your urine for an entire day. This is sometimes called a timed urine test. It can provide more information than a single urine sample. ?There are many reasons to have this test. Your health care provider may order the test to check for or  monitor the following conditions: ?High blood pressure. ?Kidney disease. ?Kidney stones. ?Urinary tract infections. ?Pregnancy. ?Diabetes. ?How do I prepare for this test? ?You may be asked to follow a special diet during or before the collection period. Follow any instructions from your health care provider. If no special instructions are given, you may eat and drink normally. ?Take over-the-counter and prescription medicines only as told by your health care provider. ?Let your health care provider know about any medicines that you are taking, including over-the-counter medicines, vitamins, herbs, and supplements. ?Choose a collection day when you can be at home or when you have a place to store the urine. All urine must be collected during the testing period. ?How do I do a 24-hour urine collection? ? ?When you get up in the morning, urinate in the toilet and flush. Write down the time. This will be your start time on the day of collection and your end time on the next morning. ?From the start time on, all of your urine should be kept in the collection jug that you received from the lab. ?If the jug that is given to you already has liquid in it, that is okay. Do not throw out the liquid or rinse out the jug. ?Urinate into a specimen container, such as a urinal or pan that sits over the toilet. Pour the urine from the container into the collection jug. Be careful not to spill any of the urine. Use the equipment provided by the lab. ?Do not let any toilet paper  or stool (feces) get into the jug. This will contaminate the sample. ?Stop collecting your urine 24 hours after you started. Collect the last specimen as close as possible to the end of the 24-hour period. ?Keep the jug cool in an ice chest or keep it in the refrigerator during collection. ?When the 24-hour collection is complete, take the jug to the lab as soon as possible. Keep the jug cool in an ice chest while you are bringing it to the lab. ?What do the  results mean? ?Talk with your health care provider about what your results mean. ?Questions to ask your health care provider ?Ask your health care provider, or the department that is doing the test: ?When will my results be ready? ?How will I get my results? ?What are my treatment options? ?What other tests do I need? ?What are my next steps? ?Summary ?A 24-hour urine specimen is a lab test that requires you to collect all of your urine for an entire day. ?When you get up in the morning, urinate in the toilet and flush. Write down the time. For the next 24 hours, collect all of your urine in the collection jug that you received from the lab. ?Keep the jug cool while collecting the urine and while bringing it back to the lab. ?Take the jug of urine back to the lab as soon as possible after the collection period has ended. ?This information is not intended to replace advice given to you by your health care provider. Make sure you discuss any questions you have with your health care provider. ?Document Revised: 12/01/2020 Document Reviewed: 12/01/2020 ?Elsevier Patient Education ? [place]. ? ?

## 2021-08-15 NOTE — Progress Notes (Signed)
ELECTROPHYSIOLOGY CONSULT NOTE  Patient ID: Alexis Mccormick, MRN: [id number], DOB/AGE: Feb 10, 1986 36 y.o. Admit date: (Not on file) Date of Consult: 08/15/2021  Primary Physician: Candida Peeling, PA-C Primary Cardiologist:       Alexis Mccormick is a 36 y.o. female who is being seen today for the evaluation of palpitatons at the request of Dr Raeford Razor.    HPI Alexis Mccormick is a 36 y.o. female referred from Dr. Raeford Razor because of a panoply of symptoms including atypical chest pain, palpitations, spiking blood pressure.  Symptoms began largely 11/22 and she describes episodic nonexertional related chest discomfort that is often on both sides of her breasts lasting 5-10 minutes.  Unassociated with exertion often occurring at rest.  No specific triggers.  Sometimes awakens her at night.  She describes it as "scary and painful "she uses her arms holding it as if it is mitigated in part by the pressure of her hands.  She also has palpitations.  She describes 2 different palpitation syndromes, the first is fast.  They occur at different times but occasionally are apparent upon awaking from sleep.  There is a comment in her ER note that her blood pressure is 180 and she was "mildly tachycardic "she describes it feels like she is panicking when she awakens from sleep with this.  She also has hard beats which she senses in her throat and in her stomach.  She has had problems with night sweats.  Evaluation for this is ongoing and she had a sed rate that was mildly elevated with a normal CRP  [facility] at [facility].  She apparently wore a Holter in January with her symptoms at work and was told it was normal.  An echocardiogram she was also told was reportedly normal     Date Cr K Hgb  2/23 0.79 3.2 11.3  3/23 0.75 4.3 11.4      Past Medical History:  Diagnosis Date   Anemia    Anxiety    Asthma    Carpal tunnel syndrome    right wrist   Chest pain 05/30/2021   ED visit for  heart palpitations, chest pain, sob. 05/30/21 chest xray & EKG normal. Troponin levels normal.See ED OV in Epic from 05/30/21.   Chest pain 07/05/2021   Per 07/05/21 ED note from Dr. Laural Golden at [facility] in [place]., chest pain appeared to be musculoskeletal.   COVID 2020   very mild symptoms   Depression    Dizziness 04/2021   Dyspnea 07/05/2021   ED visit for CP & SOB.   Fibroid    Heart palpitations 04/2021   Hypertension    Patient has never been put on BP meds. Cardiology appt 07/19/21 with Dr. Tonia Ghent per pt.   Patellofemoral syndrome of both knees    Plantar fasciitis of right foot    Pre-diabetes    per pt   Wears contact lenses    Wears glasses       Surgical History:  Past Surgical History:  Procedure Laterality Date   DILATATION & CURETTAGE/HYSTEROSCOPY WITH MYOSURE N/A 07/17/2021   Procedure: HYSTEROSCOPY WITH MYOSURE POLYPECTOMY;  Surgeon: Carlyon Shadow, MD;  Location: [facility];  Service: Gynecology;  Laterality: N/A;   ESOPHAGOGASTRODUODENOSCOPY  4128   GRAFT APPLICATION  7867   dental bone graft on lower right side   WISDOM TOOTH EXTRACTION  2022     Home Meds: Current  Meds  Medication Sig   amLODipine (NORVASC) 5 MG tablet Take 1 tablet (5 mg total) by mouth daily.   cyclobenzaprine (FLEXERIL) 10 MG tablet Take 1 tablet (10 mg total) by mouth 2 (two) times daily as needed for muscle spasms.   escitalopram (LEXAPRO) 10 MG tablet Take 10 mg by mouth daily.   ferrous sulfate 325 (65 FE) MG EC tablet Take 325 mg by mouth daily with breakfast.   Iron-FA-B Cmp-C-Biot-Probiotic (FUSION PLUS PO) Take 1 capsule by mouth daily. Patient needs to pick up prescription. Hasn't started yet. 07/10/21.   Multiple Vitamin (MULTIVITAMIN WITH MINERALS) TABS tablet Take 1 tablet by mouth daily. Gummies   Vitamin D, Ergocalciferol, (DRISDOL) 1.25 MG (50000 UNIT) CAPS capsule Take 50,000 Units by mouth once a week.     Allergies:  Allergies  Allergen Reactions   Cefdinir Other (See Comments)    Headache and dizziness   Strawberry (Diagnostic) Swelling    Lips got puffy and itchy face.    Social History   Socioeconomic History   Marital status: Single    Spouse name: Not on file   Number of children: 0   Years of education: Not on file   Highest education level: Not on file  Occupational History   Not on file  Tobacco Use   Smoking status: Never   Smokeless tobacco: Never  Vaping Use   Vaping Use: Never used  Substance and Sexual Activity   Alcohol use: Not Currently   Drug use: No   Sexual activity: Yes    Birth control/protection: None  Other Topics Concern   Not on file  Social History Narrative   Not on file   Social Determinants of Health   Financial Resource Strain: Not on file  Food Insecurity: Not on file  Transportation Needs: Not on file  Physical Activity: Not on file  Stress: Not on file  Social Connections: Not on file  Intimate Partner Violence: Not on file     Family History  Problem Relation Age of Onset   Diabetes Mother    Cancer Maternal Grandmother    Diabetes Maternal Grandfather    Breast cancer Neg Hx      ROS:  Please see the history of present illness.     All other systems reviewed and negative.    Physical Exam:  Blood pressure 110/70, pulse 69, height $RemoveBe'5\' 4"'ooXPvTJEv$  (1.626 m), weight 249 lb 12.8 oz (113.3 kg), last menstrual period 08/04/2021, SpO2 99 %. General: Well developed, Morbidly obese  female in no acute distress. Head: Normocephalic, atraumatic, sclera non-icteric, no xanthomas, nares are without discharge. EENT: normal  Lymph Nodes:  none Neck: Negative for carotid bruits. JVD not elevated. Back:without scoliosis kyphosis Lungs: Clear bilaterally to auscultation without wheezes, rales, or rhonchi. Breathing is unlabored. Heart: RRR with S1 S2. No  murmur . No rubs, or gallops appreciated. Abdomen: Soft, non-tender, non-distended  with normoactive bowel sounds. No hepatomegaly. No rebound/guarding. No obvious abdominal masses. Msk:  Strength and tone appear normal for age. Extremities: No clubbing or cyanosis. No  edema.  Distal pedal pulses are 2+ and equal bilaterally. Skin: Warm and Dry Neuro: Alert and oriented X 3. CN III-XII intact Grossly normal sensory and motor function . Psych:  Responds to questions appropriately with a normal affect.        EKG: 08/07/2021 atrial rhythm at 74 Intervals 04/19/1935 P waves inverted to 3 and F Monophasic upright lead V1 ECG 08/04/2021 sinus rhythm at 61  13/11/40 P waves upright in leads II 3 and F Biphasic V1 ECG 07/21/2021 PR interval 145Normal P waves ECG 05/31/2021 PR interval 145 with normal P waves ECG 04/15/2021 PR 130 with normal P wave     Assessment and Plan:    Palpitations  Hypertension with hypokalemia  Chest discomfort-atypical (burning typically on both sides of her breast)  Elevated ESR  Sleep disordered breathing  Morbidly obese   The patient has had palpitations.  She wore an event recorder for [facility], apparently she had her typical palpitations while wearing it.  We will try to get those data.  Review of the ECGs and the rhythm strips from the hospital note only 1 thing and that was on the ECG of 2/28 there are inverted P waves suggesting that she may have a junctional rhythm, the PR interval is short consistent with hypophysis and this can certainly be associated with a palpitation syndrome that she describes.  PVCs are also commonly associated with this but have not been seen.  Her blood pressure issues are interesting in that aVR associate with hypokalemia which prompts to exclude renin hypoaldosteronism and the other is that she has bursts of significant systolic blood pressure associated with tachycardia raising the possibility of adrenergic surges.  We will check a renin Aldo ratio and urine for metanephrines.  Anticipate referring her to  Dr. Oval Linsey in the blood pressure clinic  Her chest pain is very atypical.  Multiple negative troponins.  We will get a Myoview to exclude.  And probably given the gender, perhaps more sensitive because of the possibility of known epicardial small vessel disease.  With her hypertension and her sleep disordered breathing, would recommend a sleep study  There is a significant component of anxiety.  Have encouraged her to follow-up with PCP   Virl Axe

## 2021-08-16 ENCOUNTER — Inpatient Hospital Stay: Payer: 59

## 2021-08-17 ENCOUNTER — Telehealth (HOSPITAL_COMMUNITY): Payer: Self-pay | Admitting: *Deleted

## 2021-08-17 NOTE — Telephone Encounter (Signed)
Patient given detailed instructions per Myocardial Perfusion Study Information Sheet for the test on 08/22/21 at 1:15. Patient notified to arrive 15 minutes early and that it is imperative to arrive on time for appointment to keep from having the test rescheduled. ? If you need to cancel or reschedule your appointment, please call the office within 24 hours of your appointment. . Patient verbalized understanding.Glade Lloyd S ? ? ?

## 2021-08-19 ENCOUNTER — Other Ambulatory Visit: Payer: Self-pay

## 2021-08-19 ENCOUNTER — Ambulatory Visit
Admission: RE | Admit: 2021-08-19 | Discharge: 2021-08-19 | Disposition: A | Discharge status: Home / Self Care | Payer: 59 | Source: Ambulatory Visit | Attending: Family Medicine | Admitting: Family Medicine

## 2021-08-19 DIAGNOSIS — R0781 Pleurodynia: Secondary | ICD-10-CM

## 2021-08-19 IMAGING — MR MR CHEST MEDIASTINUM W/O CM
13 series · 16 of 16 positions shown · non-contrast
Comparison: Chest two views [DATE]

CLINICAL DATA: Chest wall pain nontraumatic. Infection or
inflammation suspected. Pain near lower sternum and xiphoid.
Symptoms for 4 months.

EXAM:
MR CHEST WITHOUT CONTRAST
TECHNIQUE: Multiplanar, multisequence MR imaging of the anterior chest wall was
performed. No intravenous contrast was administered.

[Series 3: T2 · coronal · 5.0mm · 1.56mm/px · 1 of 27 slices shown (1 of 2)]
[im 1/27]
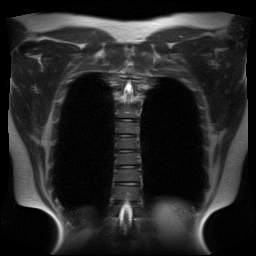

[Series 4: cor tru fisp · coronal · 4.0mm · 0.88mm/px · 1 of 34 slices shown]
[im 1/34]
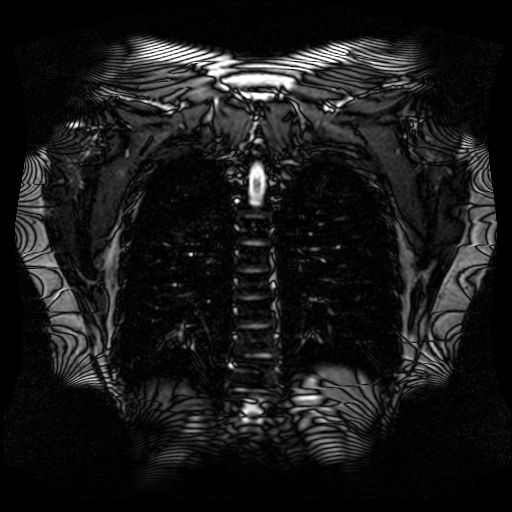

[Series 5: T2 · axial · 5.0mm · 1.64mm/px · 1 of 43 slices shown (2 of 2)]
[im 1/43]
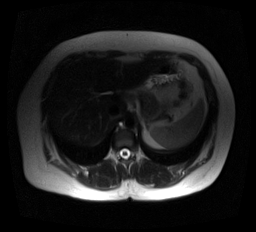

[Series 6: T2 fat-sat · axial · 4.5mm · 0.66mm/px · 1 of 52 slices shown]
[im 1/52]
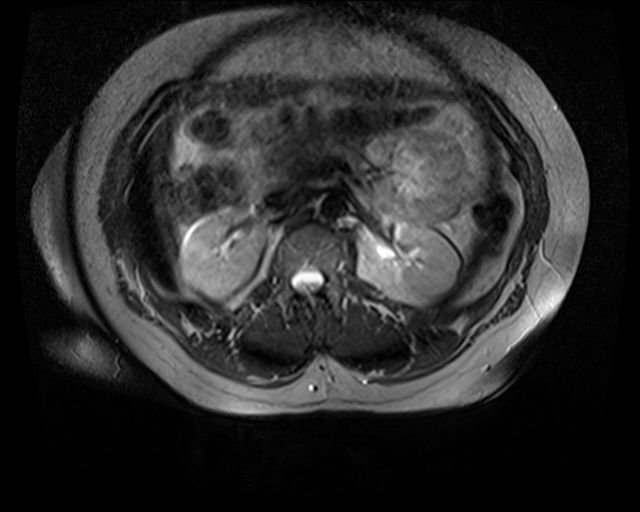

[Series 7: DWI · 1 of 3 slices shown (1 of 2)]
[im 1/3]
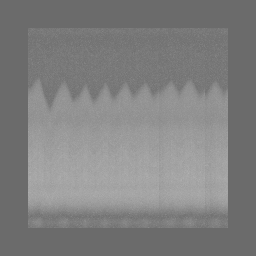

[Series 8: DWI · axial · 5.5mm · 2.19mm/px · z∈[-183,+76]mm · 2 of 123 slices shown (2 of 2)]
[im 1/123]
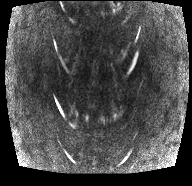
[im 123/123]
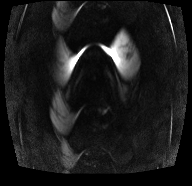

[Series 9: ax dwi_adc · axial · 5.5mm · 2.19mm/px · 1 of 42 slices shown]
[im 1/42]
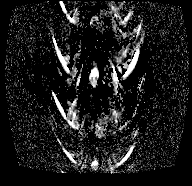

[Series 10: axial in out · axial · 5.5mm · 0.82mm/px · z∈[-187,+53]mm · 2 of 78 slices shown]
[im 1/78]
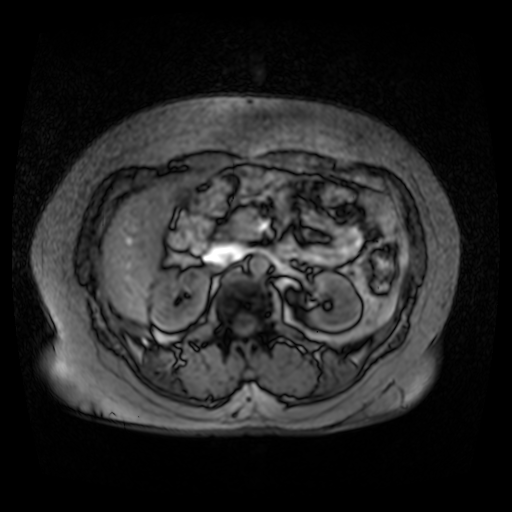
[im 78/78]
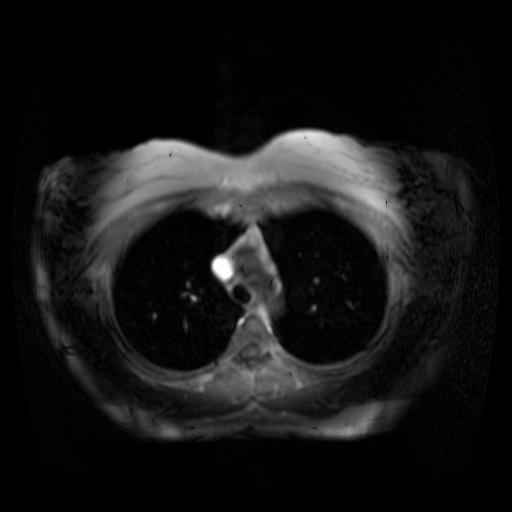

[Series 11: axial spgr no · axial · 5.0mm · 0.82mm/px · 1 of 41 slices shown]
[im 1/41]
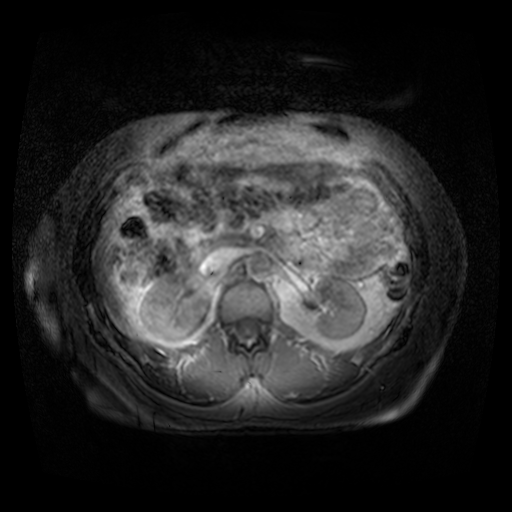

[Series 12: axial spgr fs · axial · non-contrast · 5.0mm · 1.64mm/px · 1 of 41 slices shown]
[im 1/41]
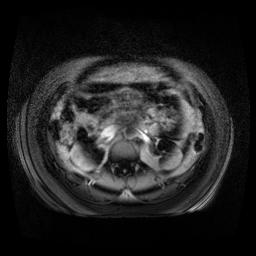

[Series 13: STIR · coronal · 5.0mm · 1.31mm/px · 1 of 34 slices shown (1 of 2)]
[im 1/34]
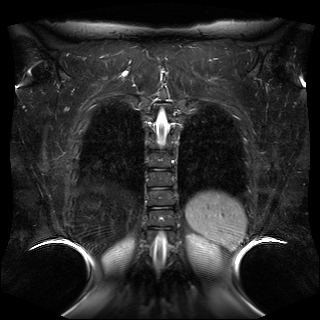

[Series 14: STIR · axial · 5.0mm · 1.31mm/px · 1 of 42 slices shown (2 of 2)]
[im 1/42]
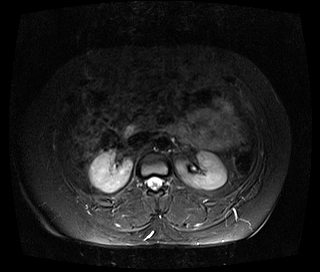

[Series 15: T1 dynamic · axial · non-contrast · 3.0mm · 0.82mm/px · z∈[-166,+71]mm · 2 of 80 slices shown]
[im 1/80]
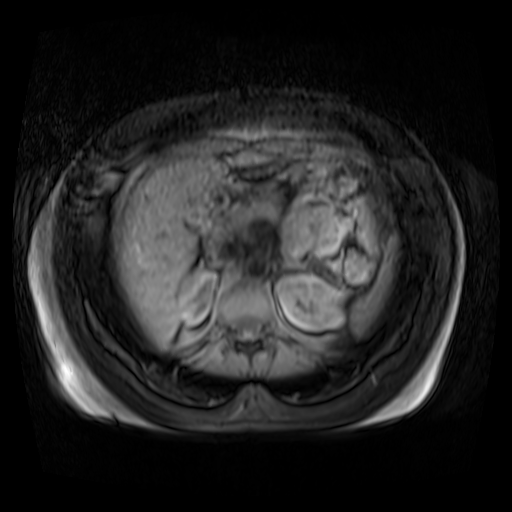
[im 80/80]
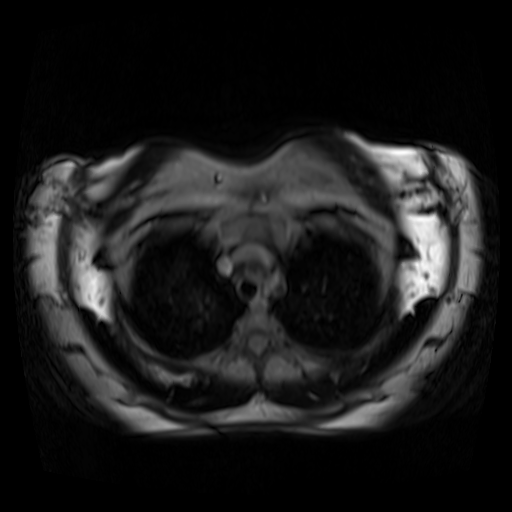

[16 of 16 positions shown; findings below may reference images not displayed]

FINDINGS: Despite efforts by the technologist and patient, motion artifact is
present on today's exam and could not be eliminated. This reduces
exam sensitivity and specificity.

Bones/Joint/Cartilage:

Reportedly the patient has nontraumatic chest wall pain is in the
region of the lower sternum and xiphoid wrapping under the breasts
and lateral lower ribs bilaterally. Within the limitations of motion
artifact normal bone marrow is seen within the visualized portions
of the sternum and ribs. Cortices appear intact.

Ligaments:

Grossly unremarkable.

Muscles and Tendons:

Normal regional muscle signal and bulk.

Soft tissue:

No axillary mediastinal or hilar lymphadenopathy is visualized.

Heart size is at the upper limits of normal.

Limited images of the upper abdomen are grossly unremarkable.
IMPRESSION: No mass, fluid collection, or other abnormality is visualized within
the imaged chest wall.

## 2021-08-20 ENCOUNTER — Other Ambulatory Visit: Payer: 59

## 2021-08-20 ENCOUNTER — Other Ambulatory Visit: Payer: Self-pay | Admitting: Internal Medicine

## 2021-08-22 ENCOUNTER — Telehealth (INDEPENDENT_AMBULATORY_CARE_PROVIDER_SITE_OTHER): Payer: 59 | Admitting: Family Medicine

## 2021-08-22 ENCOUNTER — Ambulatory Visit (HOSPITAL_COMMUNITY): Payer: 59 | Attending: Cardiology

## 2021-08-22 ENCOUNTER — Encounter: Payer: Self-pay | Admitting: Family Medicine

## 2021-08-22 ENCOUNTER — Other Ambulatory Visit: Payer: Self-pay

## 2021-08-22 DIAGNOSIS — M940 Chondrocostal junction syndrome [Tietze]: Secondary | ICD-10-CM

## 2021-08-22 DIAGNOSIS — I208 Other forms of angina pectoris: Secondary | ICD-10-CM | POA: Diagnosis present

## 2021-08-22 MED ORDER — TECHNETIUM TC 99M TETROFOSMIN IV KIT
30.7000 | PACK | Freq: Once | INTRAVENOUS | Status: AC | PRN
  Administered 2021-08-22: 30.7 via INTRAVENOUS
  Filled 2021-08-22: qty 31

## 2021-08-22 MED ORDER — TECHNETIUM TC 99M TETROFOSMIN IV KIT
30.7000 | PACK | Freq: Once | INTRAVENOUS | Status: AC | PRN
  Administered 2021-08-24: 30.4 via INTRAVENOUS
  Filled 2021-08-22: qty 31

## 2021-08-22 NOTE — Progress Notes (Signed)
Virtual Visit via Video Note ? ?I connected with Dorena Cookey on 08/22/21 at  8:00 AM EDT by a video enabled telemedicine application and verified that I am speaking with the correct person using two identifiers. ? ?Location: ?Patient: vehicle  ?Provider: office ?  ?I discussed the limitations of evaluation and management by telemedicine and the availability of in person appointments. The patient expressed understanding and agreed to proceed. ? ?History of Present Illness: ? ?Ms. Rice is a 36 yo F following up for the MRI of her chest.  This was demonstrating no structural changes.  She does continue to have chest pain intermittently. ?  ?Observations/Objective: ? ? ?Assessment and Plan: ? ?Costochondritis: ?Her MRI is reassuring with no structural changes appreciated.  Possible for his costochondritis given the symptoms that she experiences. ?-Counseled on home exercise therapy and supportive care. ? ?Follow Up Instructions: ? ?  ?I discussed the assessment and treatment plan with the patient. The patient was provided an opportunity to ask questions and all were answered. The patient agreed with the plan and demonstrated an understanding of the instructions. ?  ?The patient was advised to call back or seek an in-person evaluation if the symptoms worsen or if the condition fails to improve as anticipated. ? ? ? ?Clearance Coots, MD ? ? ?

## 2021-08-22 NOTE — Assessment & Plan Note (Signed)
Her MRI is reassuring with no structural changes appreciated.  Possible for his costochondritis given the symptoms that she experiences. ?-Counseled on home exercise therapy and supportive care. ?

## 2021-08-23 ENCOUNTER — Inpatient Hospital Stay: Payer: 59

## 2021-08-23 VITALS — BP 117/63 | HR 72 | Temp 98.2°F | Resp 18

## 2021-08-23 DIAGNOSIS — D509 Iron deficiency anemia, unspecified: Secondary | ICD-10-CM | POA: Diagnosis not present

## 2021-08-23 MED ORDER — SODIUM CHLORIDE 0.9 % IV SOLN: Freq: Once | INTRAVENOUS | Status: AC

## 2021-08-23 MED ORDER — SODIUM CHLORIDE 0.9 % IV SOLN
300.0000 mg | Freq: Once | INTRAVENOUS | Status: AC
  Administered 2021-08-23: 300 mg via INTRAVENOUS
  Filled 2021-08-23: qty 300

## 2021-08-23 NOTE — Patient Instructions (Signed)

## 2021-08-24 ENCOUNTER — Other Ambulatory Visit: Payer: Self-pay

## 2021-08-24 ENCOUNTER — Ambulatory Visit (HOSPITAL_COMMUNITY): Payer: 59 | Attending: Cardiology

## 2021-08-24 LAB — MYOCARDIAL PERFUSION IMAGING
Angina Index: 0
Duke Treadmill Score: 7
Estimated workload: 8.1
Exercise duration (min): 6 min
Exercise duration (sec): 45 s
LV dias vol: 96 mL (ref 46–106)
LV sys vol: 41 mL
MPHR: 183 {beats}/min
Nuc Stress EF: 57 %
Peak HR: 176 {beats}/min
Percent HR: 96 %
RPE: 18
Rest HR: 97 {beats}/min
Rest Nuclear Isotope Dose: 30.4 mCi
SDS: 2
SRS: 2
SSS: 5
ST Depression (mm): 0 mm
Stress Nuclear Isotope Dose: 30.7 mCi
TID: 0.73

## 2021-08-26 ENCOUNTER — Other Ambulatory Visit: Payer: Self-pay | Admitting: Internal Medicine

## 2021-08-30 ENCOUNTER — Ambulatory Visit: Payer: 59

## 2021-08-30 ENCOUNTER — Ambulatory Visit: Payer: 59 | Admitting: Family Medicine

## 2021-08-30 LAB — ALDOSTERONE + RENIN ACTIVITY W/ RATIO
ALDOS/RENIN RATIO: 4 (ref 0.0–30.0)
ALDOSTERONE: 2.1 ng/dL (ref 0.0–30.0)
Renin: 0.528 ng/mL/hr (ref 0.167–5.380)

## 2021-08-31 LAB — METANEPHRINES, URINE, 24 HOUR
Metaneph Total, Ur: 49 ug/L
Metanephrines, 24H Ur: 98 ug/24 hr (ref 36–209)
Normetanephrine, 24H Ur: 202 ug/24 hr (ref 131–612)
Normetanephrine, Ur: 101 ug/L

## 2021-09-05 ENCOUNTER — Other Ambulatory Visit: Payer: Self-pay

## 2021-09-05 ENCOUNTER — Inpatient Hospital Stay: Payer: 59

## 2021-09-05 VITALS — BP 117/42 | HR 70 | Resp 18

## 2021-09-05 DIAGNOSIS — D509 Iron deficiency anemia, unspecified: Secondary | ICD-10-CM

## 2021-09-05 MED ORDER — SODIUM CHLORIDE 0.9 % IV SOLN
300.0000 mg | Freq: Once | INTRAVENOUS | Status: AC
  Administered 2021-09-05: 300 mg via INTRAVENOUS
  Filled 2021-09-05: qty 300

## 2021-09-05 MED ORDER — SODIUM CHLORIDE 0.9 % IV SOLN: Freq: Once | INTRAVENOUS | Status: AC

## 2021-09-05 NOTE — Patient Instructions (Signed)

## 2021-09-12 ENCOUNTER — Other Ambulatory Visit: Payer: Self-pay | Admitting: *Deleted

## 2021-09-12 DIAGNOSIS — R5383 Other fatigue: Secondary | ICD-10-CM

## 2021-09-12 DIAGNOSIS — R0683 Snoring: Secondary | ICD-10-CM

## 2021-09-28 ENCOUNTER — Institutional Professional Consult (permissible substitution): Payer: 59 | Admitting: Internal Medicine

## 2021-10-04 ENCOUNTER — Ambulatory Visit (INDEPENDENT_AMBULATORY_CARE_PROVIDER_SITE_OTHER): Payer: 59 | Admitting: Family Medicine

## 2021-10-04 ENCOUNTER — Encounter: Payer: Self-pay | Admitting: Family Medicine

## 2021-10-04 VITALS — BP 112/84 | Ht 64.0 in | Wt 249.0 lb

## 2021-10-04 DIAGNOSIS — S73192D Other sprain of left hip, subsequent encounter: Secondary | ICD-10-CM

## 2021-10-04 MED ORDER — MELOXICAM 15 MG PO TABS: ORAL_TABLET | ORAL | 1 refills | Status: DC

## 2021-10-04 NOTE — Patient Instructions (Signed)
Good to see you ?Please use heat or ice  ?We'll get the MRI at [place]   ?Please send me a message in MyChart with any questions or updates.  ?We'll setup a virtual visit once the MRI is resulted.  ? ?--Dr. Raeford Razor ? ?
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
## 2021-10-13 ENCOUNTER — Ambulatory Visit
Admission: RE | Admit: 2021-10-13 | Discharge: 2021-10-13 | Disposition: A | Discharge status: Home / Self Care | Payer: 59 | Source: Ambulatory Visit | Attending: Family Medicine | Admitting: Family Medicine

## 2021-10-13 DIAGNOSIS — S73192D Other sprain of left hip, subsequent encounter: Secondary | ICD-10-CM
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
## 2021-10-18 ENCOUNTER — Encounter: Payer: Self-pay | Admitting: Family Medicine

## 2021-10-18 ENCOUNTER — Telehealth (INDEPENDENT_AMBULATORY_CARE_PROVIDER_SITE_OTHER): Payer: 59 | Admitting: Family Medicine

## 2021-10-18 DIAGNOSIS — S73192D Other sprain of left hip, subsequent encounter: Secondary | ICD-10-CM | POA: Diagnosis not present

## 2021-10-18 DIAGNOSIS — M461 Sacroiliitis, not elsewhere classified: Secondary | ICD-10-CM | POA: Insufficient documentation

## 2021-10-18 DIAGNOSIS — M533 Sacrococcygeal disorders, not elsewhere classified: Secondary | ICD-10-CM | POA: Insufficient documentation
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
## 2021-10-18 NOTE — Assessment & Plan Note (Signed)
Work-up for an autoimmune origin has been negative at this point.  She has had nonspecific inflammatory markers being positive.  This may be related to her ongoing pain in other areas. ?-Counseled on supportive care. ?-[id number] ?

## 2021-10-25 NOTE — Addendum Note (Signed)
Addended by: Cresenciano Lick on: 10/25/2021 08:59 AM   Modules accepted: Orders

## 2021-11-01 ENCOUNTER — Telehealth: Payer: Self-pay | Admitting: Family Medicine

## 2021-11-01 LAB — HLA-B27 ANTIGEN: HLA B27: NEGATIVE

## 2021-11-01 NOTE — Telephone Encounter (Signed)
Informed of results. Could consider SI joint injection.   Rosemarie Ax, MD [facility] 11/01/2021, 12:42 PM

## 2021-11-09 ENCOUNTER — Encounter: Payer: Self-pay | Admitting: Family

## 2021-11-09 ENCOUNTER — Inpatient Hospital Stay: Payer: 59 | Attending: Hematology & Oncology

## 2021-11-09 ENCOUNTER — Inpatient Hospital Stay (HOSPITAL_BASED_OUTPATIENT_CLINIC_OR_DEPARTMENT_OTHER): Payer: 59 | Admitting: Family

## 2021-11-09 ENCOUNTER — Other Ambulatory Visit: Payer: Self-pay

## 2021-11-09 VITALS — BP 131/81 | HR 83 | Temp 98.2°F | Resp 18 | Ht 64.0 in | Wt 265.4 lb

## 2021-11-09 DIAGNOSIS — D509 Iron deficiency anemia, unspecified: Secondary | ICD-10-CM

## 2021-11-09 LAB — CBC WITH DIFFERENTIAL (CANCER CENTER ONLY)
Abs Immature Granulocytes: 0.06 10*3/uL (ref 0.00–0.07)
Basophils Absolute: 0 10*3/uL (ref 0.0–0.1)
Basophils Relative: 0 %
Eosinophils Absolute: 0 10*3/uL (ref 0.0–0.5)
Eosinophils Relative: 1 %
HCT: 37.2 % (ref 36.0–46.0)
Hemoglobin: 11.8 g/dL — ABNORMAL LOW (ref 12.0–15.0)
Immature Granulocytes: 1 %
Lymphocytes Relative: 34 %
Lymphs Abs: 1.8 10*3/uL (ref 0.7–4.0)
MCH: 26.3 pg (ref 26.0–34.0)
MCHC: 31.7 g/dL (ref 30.0–36.0)
MCV: 82.9 fL (ref 80.0–100.0)
Monocytes Absolute: 0.4 10*3/uL (ref 0.1–1.0)
Monocytes Relative: 7 %
Neutro Abs: 3 10*3/uL (ref 1.7–7.7)
Neutrophils Relative %: 57 %
Platelet Count: 276 10*3/uL (ref 150–400)
RBC: 4.49 MIL/uL (ref 3.87–5.11)
RDW: 14.9 % (ref 11.5–15.5)
WBC Count: 5.3 10*3/uL (ref 4.0–10.5)
nRBC: 0 % (ref 0.0–0.2)

## 2021-11-09 LAB — IRON AND IRON BINDING CAPACITY (CC-WL,HP ONLY)
Iron: 85 ug/dL (ref 28–170)
Saturation Ratios: 28 % (ref 10.4–31.8)
TIBC: 308 ug/dL (ref 250–450)
UIBC: 223 ug/dL

## 2021-11-09 LAB — RETICULOCYTES
Immature Retic Fract: 3.4 % (ref 2.3–15.9)
RBC.: 4.52 MIL/uL (ref 3.87–5.11)
Retic Count, Absolute: 38.9 10*3/uL (ref 19.0–186.0)
Retic Ct Pct: 0.9 % (ref 0.4–3.1)

## 2021-11-09 LAB — FERRITIN: Ferritin: 78 ng/mL (ref 11–307)
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
## 2021-11-13 ENCOUNTER — Telehealth: Payer: Self-pay | Admitting: *Deleted

## 2021-11-13 NOTE — Telephone Encounter (Signed)
Per 11/09/21 los - called and lvm of upcoming appointments - requested call back to confirm

## 2021-11-19 ENCOUNTER — Ambulatory Visit (INDEPENDENT_AMBULATORY_CARE_PROVIDER_SITE_OTHER): Payer: 59 | Admitting: Family Medicine

## 2021-11-19 ENCOUNTER — Ambulatory Visit: Payer: Self-pay

## 2021-11-19 VITALS — BP 146/90 | Ht 64.0 in | Wt 260.0 lb

## 2021-11-19 DIAGNOSIS — M533 Sacrococcygeal disorders, not elsewhere classified: Secondary | ICD-10-CM | POA: Diagnosis not present

## 2021-11-19 MED ORDER — METHYLPREDNISOLONE ACETATE 40 MG/ML IJ SUSP
40.0000 mg | Freq: Once | INTRAMUSCULAR | Status: AC
  Administered 2021-11-19: 40 mg via INTRA_ARTICULAR

## 2021-11-19 NOTE — Assessment & Plan Note (Signed)
Acutely occurring. Having tenderness over the left side as well as some gluteus pain.  She does have some hypermobility which could be currently normal. -Counseled on home exercise therapy and supportive care. -Injection today. -Could consider physical therapy.

## 2021-11-19 NOTE — Patient Instructions (Signed)
Good to see you Please try heat  Please try the exercise  Please check out body braid  Please check out [email or web address]   Please send me a message in MyChart with any questions or updates.  Please see me back in 4-6 weeks.   --Dr. Raeford Razor

## 2021-11-19 NOTE — Progress Notes (Signed)
Alexis Mccormick - 36 y.o. female MRN [id number]  Date of birth: February 26, 1986  SUBJECTIVE:  Including CC & ROS.  No chief complaint on file.   Alexis Mccormick is a 36 y.o. female that is  here for an si joint injection.    Review of Systems See HPI   HISTORY: Past Medical, Surgical, Social, and Family History Reviewed & Updated per EMR.   Pertinent Historical Findings include:  Past Medical History:  Diagnosis Date   Anemia    Anxiety    Asthma    Carpal tunnel syndrome    right wrist   Chest pain 05/30/2021   ED visit for heart palpitations, chest pain, sob. 05/30/21 chest xray & EKG normal. Troponin levels normal.See ED OV in Epic from 05/30/21.   Chest pain 07/05/2021   Per 07/05/21 ED note from Dr. Laural Golden at [facility] in [place] chest pain appeared to be musculoskeletal.   COVID 2020   very mild symptoms   Depression    Dizziness 04/2021   Dyspnea 07/05/2021   ED visit for CP & SOB.   Fibroid    Heart palpitations 04/2021   Hypertension    Patient has never been put on BP meds. Cardiology appt 07/19/21 with Dr. Tonia Ghent per pt.   Patellofemoral syndrome of both knees    Plantar fasciitis of right foot    Pre-diabetes    per pt   Wears contact lenses    Wears glasses     Past Surgical History:  Procedure Laterality Date   DILATATION & CURETTAGE/HYSTEROSCOPY WITH MYOSURE N/A 07/17/2021   Procedure: HYSTEROSCOPY WITH MYOSURE POLYPECTOMY;  Surgeon: Carlyon Shadow, MD;  Location: [facility];  Service: Gynecology;  Laterality: N/A;   ESOPHAGOGASTRODUODENOSCOPY  7654   GRAFT APPLICATION  6503   dental bone graft on lower right side   WISDOM TOOTH EXTRACTION  2022     PHYSICAL EXAM:  VS: BP (!) 146/90   Ht '5\' 4"'$  (1.626 m)   Wt 260 lb (117.9 kg)   BMI 44.63 kg/m  Physical Exam Gen: NAD, alert, cooperative with exam, well-appearing MSK:  Neurovascularly intact     Aspiration/Injection Procedure  Note Lodema Toppins 03-31-1986  Procedure: Injection Indications: Left SI joint pain  Procedure Details Consent: Risks of procedure as well as the alternatives and risks of each were explained to the (patient/caregiver).  Consent for procedure obtained. Time Out: Verified patient identification, verified procedure, site/side was marked, verified correct patient position, special equipment/implants available, medications/allergies/relevent history reviewed, required imaging and test results available.  Performed.  The area was cleaned with iodine and alcohol swabs.    The left SI joint was injected using 3 cc of 1% lidocaine on a 22-gauge  3-1/2 inch needle.  The syringe was switched to mixture containing 1 cc's of 40 mg Kenalog and 4 cc's of 0.5% bupivacaine was injected.  Ultrasound was needed to view the visualization of the needle into the endpoint of the joint space.  Ultrasound was used. Images were obtained in long views showing the injection.     A sterile dressing was applied.  Patient did tolerate procedure well.     ASSESSMENT & PLAN:   Sacroiliac joint dysfunction of left side Acutely occurring. Having tenderness over the left side as well as some gluteus pain.  She does have some hypermobility which could be currently normal. -Counseled on home exercise therapy and supportive care. -Injection today. -Could consider physical  therapy.

## 2021-12-17 ENCOUNTER — Ambulatory Visit (INDEPENDENT_AMBULATORY_CARE_PROVIDER_SITE_OTHER): Payer: 59 | Admitting: Family Medicine

## 2021-12-17 VITALS — BP 120/76 | Ht 64.0 in | Wt 260.0 lb

## 2021-12-17 DIAGNOSIS — N398 Other specified disorders of urinary system: Secondary | ICD-10-CM

## 2021-12-17 DIAGNOSIS — M357 Hypermobility syndrome: Secondary | ICD-10-CM | POA: Diagnosis not present

## 2021-12-17 DIAGNOSIS — M533 Sacrococcygeal disorders, not elsewhere classified: Secondary | ICD-10-CM

## 2021-12-17 DIAGNOSIS — N898 Other specified noninflammatory disorders of vagina: Secondary | ICD-10-CM | POA: Diagnosis not present

## 2021-12-17 NOTE — Assessment & Plan Note (Signed)
Seems to be complicating her SI joint/lower back type pain. -Counseled on home exercise therapy and supportive care. -Referral to physical therapy.

## 2021-12-17 NOTE — Assessment & Plan Note (Signed)
Has irritation and dryness that is ongoing for the past year.  Work-up has been negative thus far.   -Referral to urogynecology.

## 2021-12-17 NOTE — Assessment & Plan Note (Addendum)
Acute on chronic in nature.  Reports that she is a colonizer of strep B despite repeated bouts of antibiotics.  Hypermobility may be playing a role. -Counseled on supportive care. -Referral to urogynecology

## 2021-12-17 NOTE — Progress Notes (Signed)
  Alexis Mccormick - 36 y.o. female MRN [id number]  Date of birth: January 24, 1986  SUBJECTIVE:  Including CC & ROS.  No chief complaint on file.   Alexis Mccormick is a 36 y.o. female that is presenting with acute on chronic bilateral SI joint pain and acute on chronic dysfunction with vaginal dryness.  She recently received a left-sided SI joint injection and did not experience any significant improvement.  Her symptoms are intermittent in nature.  She also has a about a year of abnormal voiding and vaginal dryness.  Work-up thus far has been negative.  She has had her hormones checked and they have all been normal.   Review of Systems See HPI   HISTORY: Past Medical, Surgical, Social, and Family History Reviewed & Updated per EMR.   Pertinent Historical Findings include:  Past Medical History:  Diagnosis Date   Anemia    Anxiety    Asthma    Carpal tunnel syndrome    right wrist   Chest pain 05/30/2021   ED visit for heart palpitations, chest pain, sob. 05/30/21 chest xray & EKG normal. Troponin levels normal.See ED OV in Epic from 05/30/21.   Chest pain 07/05/2021   Per 07/05/21 ED note from Dr. Laural Golden at [facility] in [place] chest pain appeared to be musculoskeletal.   COVID 2020   very mild symptoms   Depression    Dizziness 04/2021   Dyspnea 07/05/2021   ED visit for CP & SOB.   Fibroid    Heart palpitations 04/2021   Hypertension    Patient has never been put on BP meds. Cardiology appt 07/19/21 with Dr. Tonia Ghent per pt.   Patellofemoral syndrome of both knees    Plantar fasciitis of right foot    Pre-diabetes    per pt   Wears contact lenses    Wears glasses     Past Surgical History:  Procedure Laterality Date   DILATATION & CURETTAGE/HYSTEROSCOPY WITH MYOSURE N/A 07/17/2021   Procedure: HYSTEROSCOPY WITH MYOSURE POLYPECTOMY;  Surgeon: Carlyon Shadow, MD;  Location: [facility];  Service: Gynecology;   Laterality: N/A;   ESOPHAGOGASTRODUODENOSCOPY  6761   GRAFT APPLICATION  9509   dental bone graft on lower right side   WISDOM TOOTH EXTRACTION  2022     PHYSICAL EXAM:  VS: BP 120/76   Ht '5\' 4"'$  (1.626 m)   Wt 260 lb (117.9 kg)   BMI 44.63 kg/m  Physical Exam Gen: NAD, alert, cooperative with exam, well-appearing MSK:  Neurovascularly intact       ASSESSMENT & PLAN:   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 -Counseled on home exercise therapy and supportive care. -Referral to physical therapy.  Sacroiliac joint dysfunction of left side No improvement with recent SI joint injection.  Symptoms more in flexion and intermittent in nature. -Counseled on home exercise therapy and supportive care. -Referral to physical therapy.   Dysfunctional voiding of urine Acute on chronic in nature.  Reports that she is a colonizer of strep B despite repeated bouts of antibiotics.  Hypermobility may be playing a role. -Counseled on supportive care. -Referral to urogynecology  Vaginal dryness Has irritation and dryness that is ongoing for the past year.  Work-up has been negative thus far.   -Referral to urogynecology.

## 2021-12-17 NOTE — Assessment & Plan Note (Signed)
No improvement with recent SI joint injection.  Symptoms more in flexion and intermittent in nature. -Counseled on home exercise therapy and supportive care. -Referral to physical therapy.
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
## 2022-01-10 ENCOUNTER — Ambulatory Visit: Payer: 59 | Attending: Family Medicine | Admitting: Physical Therapy

## 2022-01-10 ENCOUNTER — Encounter: Payer: Self-pay | Admitting: Physical Therapy

## 2022-01-10 DIAGNOSIS — M533 Sacrococcygeal disorders, not elsewhere classified: Secondary | ICD-10-CM | POA: Diagnosis present

## 2022-01-10 DIAGNOSIS — M357 Hypermobility syndrome: Secondary | ICD-10-CM | POA: Diagnosis present
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
## 2022-01-14 ENCOUNTER — Ambulatory Visit: Payer: 59 | Admitting: Physical Therapy

## 2022-01-14 ENCOUNTER — Encounter: Payer: Self-pay | Admitting: Physical Therapy

## 2022-01-14 DIAGNOSIS — M357 Hypermobility syndrome: Secondary | ICD-10-CM

## 2022-01-14 DIAGNOSIS — M533 Sacrococcygeal disorders, not elsewhere classified: Secondary | ICD-10-CM

## 2022-01-21 ENCOUNTER — Encounter: Payer: Self-pay | Admitting: Physical Therapy

## 2022-01-21 ENCOUNTER — Ambulatory Visit: Payer: 59 | Admitting: Physical Therapy

## 2022-01-21 DIAGNOSIS — M357 Hypermobility syndrome: Secondary | ICD-10-CM | POA: Diagnosis not present

## 2022-01-21 DIAGNOSIS — M533 Sacrococcygeal disorders, not elsewhere classified: Secondary | ICD-10-CM
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
## 2022-01-23 ENCOUNTER — Ambulatory Visit: Payer: 59 | Admitting: Physical Therapy

## 2022-01-23 ENCOUNTER — Encounter: Payer: Self-pay | Admitting: Physical Therapy

## 2022-01-23 DIAGNOSIS — M533 Sacrococcygeal disorders, not elsewhere classified: Secondary | ICD-10-CM

## 2022-01-23 DIAGNOSIS — M357 Hypermobility syndrome: Secondary | ICD-10-CM
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
## 2022-01-30 ENCOUNTER — Ambulatory Visit: Payer: 59 | Admitting: Physical Therapy

## 2022-01-30 ENCOUNTER — Encounter: Payer: Self-pay | Admitting: Physical Therapy

## 2022-01-30 DIAGNOSIS — M357 Hypermobility syndrome: Secondary | ICD-10-CM

## 2022-01-30 DIAGNOSIS — M533 Sacrococcygeal disorders, not elsewhere classified: Secondary | ICD-10-CM
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
## 2022-03-11 ENCOUNTER — Inpatient Hospital Stay (HOSPITAL_BASED_OUTPATIENT_CLINIC_OR_DEPARTMENT_OTHER): Payer: 59 | Admitting: Family

## 2022-03-11 ENCOUNTER — Encounter: Payer: Self-pay | Admitting: Family

## 2022-03-11 ENCOUNTER — Inpatient Hospital Stay: Payer: 59 | Attending: Hematology & Oncology

## 2022-03-11 ENCOUNTER — Other Ambulatory Visit: Payer: Self-pay

## 2022-03-11 VITALS — BP 140/82 | HR 71 | Temp 98.1°F | Resp 18 | Ht 64.0 in | Wt 240.0 lb

## 2022-03-11 DIAGNOSIS — D509 Iron deficiency anemia, unspecified: Secondary | ICD-10-CM | POA: Diagnosis present

## 2022-03-11 LAB — CBC WITH DIFFERENTIAL (CANCER CENTER ONLY)
Abs Immature Granulocytes: 0.06 10*3/uL (ref 0.00–0.07)
Basophils Absolute: 0 10*3/uL (ref 0.0–0.1)
Basophils Relative: 0 %
Eosinophils Absolute: 0.1 10*3/uL (ref 0.0–0.5)
Eosinophils Relative: 1 %
HCT: 38.2 % (ref 36.0–46.0)
Hemoglobin: 12.1 g/dL (ref 12.0–15.0)
Immature Granulocytes: 1 %
Lymphocytes Relative: 32 %
Lymphs Abs: 1.6 10*3/uL (ref 0.7–4.0)
MCH: 26.5 pg (ref 26.0–34.0)
MCHC: 31.7 g/dL (ref 30.0–36.0)
MCV: 83.8 fL (ref 80.0–100.0)
Monocytes Absolute: 0.3 10*3/uL (ref 0.1–1.0)
Monocytes Relative: 5 %
Neutro Abs: 3.1 10*3/uL (ref 1.7–7.7)
Neutrophils Relative %: 61 %
Platelet Count: 278 10*3/uL (ref 150–400)
RBC: 4.56 MIL/uL (ref 3.87–5.11)
RDW: 14.7 % (ref 11.5–15.5)
WBC Count: 5.1 10*3/uL (ref 4.0–10.5)
nRBC: 0 % (ref 0.0–0.2)

## 2022-03-11 LAB — RETICULOCYTES
Immature Retic Fract: 2.6 % (ref 2.3–15.9)
RBC.: 4.57 MIL/uL (ref 3.87–5.11)
Retic Count, Absolute: 33.4 10*3/uL (ref 19.0–186.0)
Retic Ct Pct: 0.7 % (ref 0.4–3.1)

## 2022-03-11 LAB — IRON AND IRON BINDING CAPACITY (CC-WL,HP ONLY)
Iron: 32 ug/dL (ref 28–170)
Saturation Ratios: 11 % (ref 10.4–31.8)
TIBC: 300 ug/dL (ref 250–450)
UIBC: 268 ug/dL (ref 148–442)

## 2022-03-11 LAB — FERRITIN: Ferritin: 46 ng/mL (ref 11–307)

## 2022-03-11 NOTE — Progress Notes (Signed)
Hematology and Oncology Follow Up Visit  Alexis Mccormick [id number] 12-21-1985 36 y.o. 03/11/2022   Principle Diagnosis:  Iron deficiency anemia    Current Therapy:        IV iron as indicated                Interim History:  Alexis Mccormick is here today for follow-up. She is doing quite well and has no complaints at this time.  Her energy has improved.  She has started intermittent fasting and portion control as well as eliminating soda from her diet.  Her weight is down 20 lbs since we last saw her and she is so excited.  She is staying well hydrated.  Her cycle is irregular not always occurring every month. No other blood loss noted. No bruising or petechiae.  She is having vaginal dryness and will be seeing a uro gynecologist next week.  No fever, chills, n/v, cough, rash, dizziness, SOB, chest pain, palpitations, abdominal pain or changes in bowel or bladder habits.  No swelling, tenderness, numbness or tingling in her extremities.  No falls or syncope.   ECOG Performance Status: 0 - Asymptomatic  Medications:  Allergies as of 03/11/2022       Reactions   Strawberry (diagnostic) Itching, Swelling   Lips got puffy and itchy face.   Cefdinir Other (See Comments)   Headache and dizziness        Medication List        Accurate as of March 11, 2022  9:53 AM. If you have any questions, ask your nurse or doctor.          STOP taking these medications    FUSION PLUS PO Stopped by: Lottie Dawson, NP       TAKE these medications    ferrous sulfate 325 (65 FE) MG EC tablet Take 325 mg by mouth daily with breakfast.   multivitamin with minerals Tabs tablet Take 1 tablet by mouth daily. Gummies   Vitamin D (Ergocalciferol) 1.25 MG (50000 UNIT) Caps capsule Commonly known as: DRISDOL Take 50,000 Units by mouth once a week.        Allergies:  Allergies  Allergen Reactions   Strawberry (Diagnostic) Itching and Swelling    Lips got puffy and itchy face.    Cefdinir Other (See Comments)    Headache and dizziness    Past Medical History, Surgical history, Social history, and Family History were reviewed and updated.  Review of Systems: All other 10 point review of systems is negative.   Physical Exam:  height is '5\' 4"'$  (1.626 m) and weight is 240 lb (108.9 kg). Her oral temperature is 98.1 F (36.7 C). Her blood pressure is 140/82 (abnormal) and her pulse is 71. Her respiration is 18 and oxygen saturation is 98%.   Wt Readings from Last 3 Encounters:  03/11/22 240 lb (108.9 kg)  12/17/21 260 lb (117.9 kg)  11/19/21 260 lb (117.9 kg)    Ocular: Sclerae unicteric, pupils equal, round and reactive to light Ear-nose-throat: Oropharynx clear, dentition fair Lymphatic: No cervical or supraclavicular adenopathy Lungs no rales or rhonchi, good excursion bilaterally Heart regular rate and rhythm, no murmur appreciated Abd soft, nontender, positive bowel sounds MSK no focal spinal tenderness, no joint edema Neuro: non-focal, well-oriented, appropriate affect Breasts: Deferred   Lab Results  Component Value Date   WBC 5.1 03/11/2022   HGB 12.1 03/11/2022   HCT 38.2 03/11/2022   MCV 83.8 03/11/2022   PLT 278 03/11/2022  Lab Results  Component Value Date   FERRITIN 78 11/09/2021   IRON 85 11/09/2021   TIBC 308 11/09/2021   UIBC 223 11/09/2021   IRONPCTSAT 28 11/09/2021   Lab Results  Component Value Date   RETICCTPCT 0.7 03/11/2022   RBC 4.57 03/11/2022   RBC 4.56 03/11/2022   No results found for: "KPAFRELGTCHN", "LAMBDASER", "KAPLAMBRATIO" No results found for: "IGGSERUM", "IGA", "IGMSERUM" No results found for: "TOTALPROTELP", "ALBUMINELP", "A1GS", "A2GS", "BETS", "BETA2SER", "GAMS", "MSPIKE", "SPEI"   Chemistry      Component Value Date/Time   NA 140 08/10/2021 0834   K 4.3 08/10/2021 0834   CL 106 08/10/2021 0834   CO2 28 08/10/2021 0834   BUN 6 08/10/2021 0834   CREATININE 0.75 08/10/2021 0834      Component Value  Date/Time   CALCIUM 9.3 08/10/2021 0834   ALKPHOS 54 08/10/2021 0834   AST 13 (L) 08/10/2021 0834   ALT 12 08/10/2021 0834   BILITOT 0.3 08/10/2021 0834       Impression and Plan: Alexis Mccormick is a very pleasant 36 yo female with history of intermittent iron deficiency anemia.  Iron studies are pending.  Follow-up in 4 months.   Lottie Dawson, NP 10/2/20239:53 AM

## 2022-06-14 ENCOUNTER — Ambulatory Visit (INDEPENDENT_AMBULATORY_CARE_PROVIDER_SITE_OTHER): Payer: 59 | Admitting: Obstetrics and Gynecology

## 2022-06-14 ENCOUNTER — Other Ambulatory Visit (HOSPITAL_COMMUNITY)
Admission: RE | Admit: 2022-06-14 | Discharge: 2022-06-14 | Disposition: A | Discharge status: Home / Self Care | Payer: 59 | Source: Ambulatory Visit | Attending: Obstetrics and Gynecology | Admitting: Obstetrics and Gynecology

## 2022-06-14 ENCOUNTER — Encounter: Payer: Self-pay | Admitting: Obstetrics and Gynecology

## 2022-06-14 VITALS — BP 136/87 | HR 66 | Ht 65.25 in | Wt 252.0 lb

## 2022-06-14 DIAGNOSIS — M62838 Other muscle spasm: Secondary | ICD-10-CM

## 2022-06-14 DIAGNOSIS — N949 Unspecified condition associated with female genital organs and menstrual cycle: Secondary | ICD-10-CM | POA: Diagnosis present

## 2022-06-14 DIAGNOSIS — N3941 Urge incontinence: Secondary | ICD-10-CM | POA: Diagnosis not present

## 2022-06-14 DIAGNOSIS — R350 Frequency of micturition: Secondary | ICD-10-CM

## 2022-06-14 DIAGNOSIS — N9489 Other specified conditions associated with female genital organs and menstrual cycle: Secondary | ICD-10-CM

## 2022-06-14 LAB — POCT URINALYSIS DIPSTICK
Bilirubin, UA: NEGATIVE
Glucose, UA: NEGATIVE
Ketones, UA: NEGATIVE
Leukocytes, UA: NEGATIVE
Nitrite, UA: NEGATIVE
Protein, UA: NEGATIVE
Spec Grav, UA: 1.015 (ref 1.010–1.025)
Urobilinogen, UA: 0.2 E.U./dL
pH, UA: 6 (ref 5.0–8.0)

## 2022-06-14 MED ORDER — LIDOCAINE 5 % EX OINT: 1.0000 | TOPICAL_OINTMENT | CUTANEOUS | 1 refills | Status: AC | PRN

## 2022-06-14 MED ORDER — DIAZEPAM 5 MG PO TABS: ORAL_TABLET | ORAL | 0 refills | Status: AC

## 2022-06-14 NOTE — Patient Instructions (Addendum)
Take probiotic with lactobacillus crispatus.   I will prescribe valium 5 mg pills to place vaginally up to 2 times a day for vaginal muscle spasms. Start at night and take one every night for the next several weeks to see if it improves your symptoms. Once you are improving you can taper off the medication and just use as needed. If the medication makes you drowsy then only use at bedtime and/or we can reduce the dose. Do not use gel to place it, as that will prevent the tablet from dissolving.  Instead place 1-2 drops of water on the table before inserting it in the vagina. If the pills do not dissolve well we can switch to a special compounded suppository. Let me know how you are doing on the medication and if you have any questions.     Today we talked about ways to manage bladder urgency such as altering your diet to avoid irritative beverages and foods (bladder diet) as well as attempting to decrease stress and other exacerbating factors.   The Most Bothersome Foods* The Least Bothersome Foods*  Coffee - Regular & Decaf Tea - caffeinated Carbonated beverages - cola, non-colas, diet & caffeine-free Alcohols - Beer, Red Wine, White Wine, Champagne Fruits - Grapefruit, [place], Orange, [employer] - Cranberry, Grapefruit, Orange, Pineapple Vegetables - Tomato & Tomato Products Flavor Enhancers - Hot peppers, Spicy foods, Chili, Horseradish, Vinegar, Monosodium glutamate (MSG) Artificial Sweeteners - NutraSweet, Sweet 'N Low, Equal (sweetener), Saccharin Ethnic foods - [place], [place], [place] food [employer] Scripts - low-fat & whole Fruits - Bananas, Blueberries, Honeydew melon, Pears, Raisins, Watermelon Vegetables - Broccoli, Brussels Sprouts, [place], Carrots, Cauliflower, [place], Cucumber, Mushrooms, Peas, Radishes, Squash, Zucchini, White potatoes, Sweet potatoes & yams Poultry - Chicken, Eggs, [place], [employer] - Beef, Programmer, multimedia, Lamb Seafood - Shrimp, Lib[place]in Torres fish, Salmon Grains - Oat, Rice Snacks  - Pretzels, Popcorn  *Lissa Morales et al. Diet and its role in interstitial cystitis/bladder pain syndrome (IC/BPS) and comorbid conditions. Banks 2012 Jan 11.
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
## 2022-06-17 LAB — CERVICOVAGINAL ANCILLARY ONLY
Bacterial Vaginitis (gardnerella): NEGATIVE
Candida Glabrata: NEGATIVE
Candida Vaginitis: NEGATIVE
Chlamydia: NEGATIVE
Comment: NEGATIVE
Comment: NEGATIVE
Comment: NEGATIVE
Comment: NEGATIVE
Comment: NEGATIVE
Comment: NORMAL
Neisseria Gonorrhea: NEGATIVE
Trichomonas: NEGATIVE

## 2022-07-18 ENCOUNTER — Telehealth: Payer: Self-pay | Admitting: *Deleted

## 2022-07-18 ENCOUNTER — Inpatient Hospital Stay: Payer: 59 | Admitting: Medical Oncology

## 2022-07-18 ENCOUNTER — Inpatient Hospital Stay: Payer: 59

## 2022-07-18 NOTE — Telephone Encounter (Signed)
Patient called after hours and requested to cancel appointment for today and will call back to reschedule.

## 2022-07-29 ENCOUNTER — Encounter: Payer: Self-pay | Admitting: *Deleted

## 2022-08-13 ENCOUNTER — Ambulatory Visit: Payer: 59 | Admitting: Obstetrics and Gynecology

## 2022-09-23 ENCOUNTER — Encounter: Payer: Self-pay | Admitting: *Deleted

## 2024-06-21 ENCOUNTER — Encounter: Payer: Self-pay | Admitting: *Deleted
